# Patient Record
Sex: Female | Born: 1980 | Race: Black or African American | Hispanic: No | Marital: Single | Smoking: Current every day smoker
Health system: Southern US, Community
[De-identification: ages and names within clinical notes are randomized; demographics above are authoritative.]

## PROBLEM LIST (undated history)

## (undated) DIAGNOSIS — J45990 Exercise induced bronchospasm: Secondary | ICD-10-CM

## (undated) DIAGNOSIS — O24419 Gestational diabetes mellitus in pregnancy, unspecified control: Secondary | ICD-10-CM

## (undated) HISTORY — DX: Gestational diabetes mellitus in pregnancy, unspecified control: O24.419

## (undated) HISTORY — PX: NO PAST SURGERIES: SHX2092

## (undated) HISTORY — PX: I & D EXTREMITY: SHX5045

## (undated) HISTORY — DX: Exercise induced bronchospasm: J45.990

## (undated) HISTORY — PX: CHOLECYSTECTOMY: SHX55

---

## 2004-04-10 ENCOUNTER — Inpatient Hospital Stay: Payer: Self-pay

## 2004-04-12 ENCOUNTER — Other Ambulatory Visit: Payer: Self-pay

## 2005-12-07 ENCOUNTER — Inpatient Hospital Stay: Payer: Self-pay | Admitting: Internal Medicine

## 2005-12-07 ENCOUNTER — Other Ambulatory Visit: Payer: Self-pay

## 2006-05-28 ENCOUNTER — Emergency Department: Payer: Self-pay | Admitting: Emergency Medicine

## 2007-06-29 ENCOUNTER — Inpatient Hospital Stay: Payer: Self-pay | Admitting: Internal Medicine

## 2007-10-10 IMAGING — CT CT CHEST W/O CM
3 of 4 series · 17 of 32 positions shown, 19 images · non-contrast
Comparison: none

REASON FOR EXAM: HIGH RESOLUTION TO EVALUATE FOR ILD
COMMENTS:

[Series 2: soft tissue · axial · 0.57mm/px · z∈[+306,+480]mm · 7 of 49 slices shown]
[im 7/49  mediastinal]
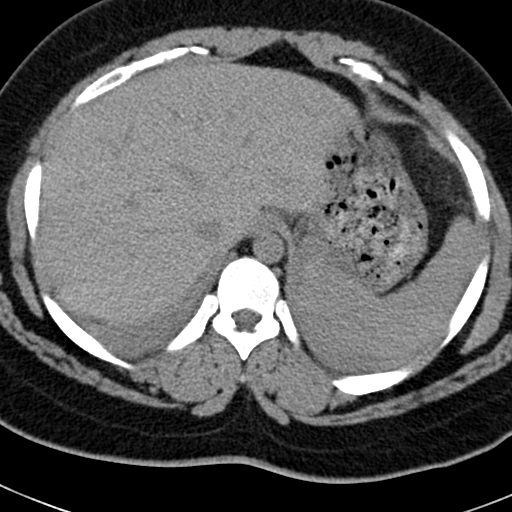
[im 14/49  mediastinal]
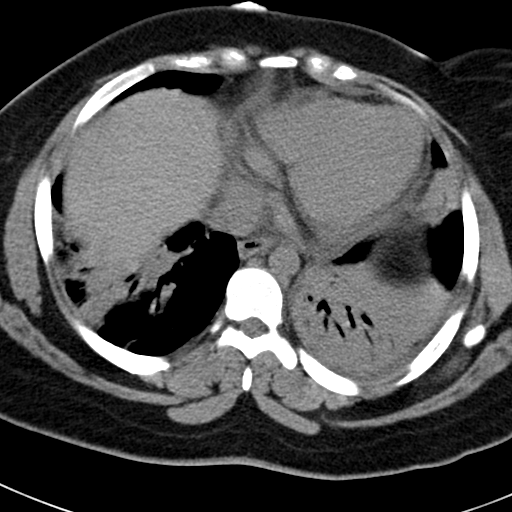
[im 21/49  mediastinal]
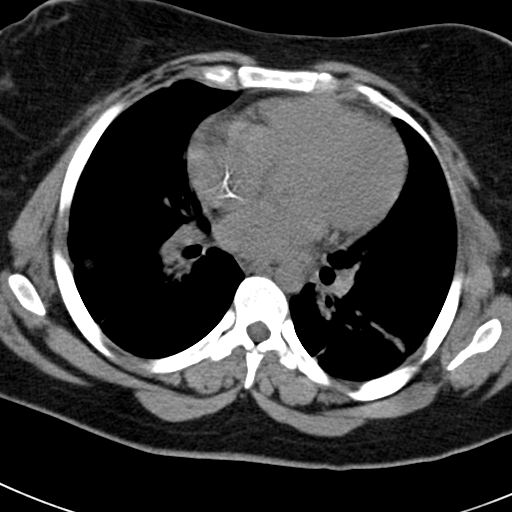
[im 24/49  mediastinal]
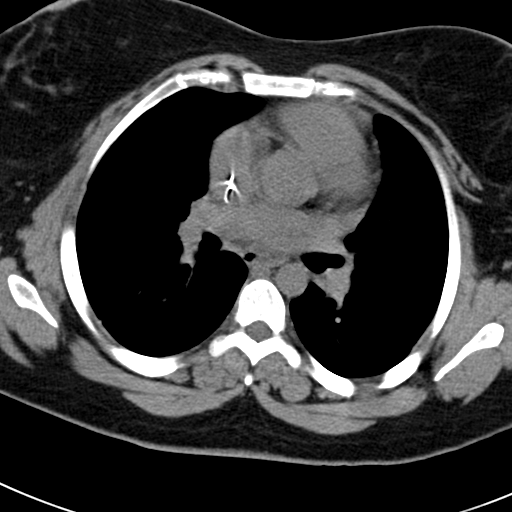
[im 28/49  mediastinal]
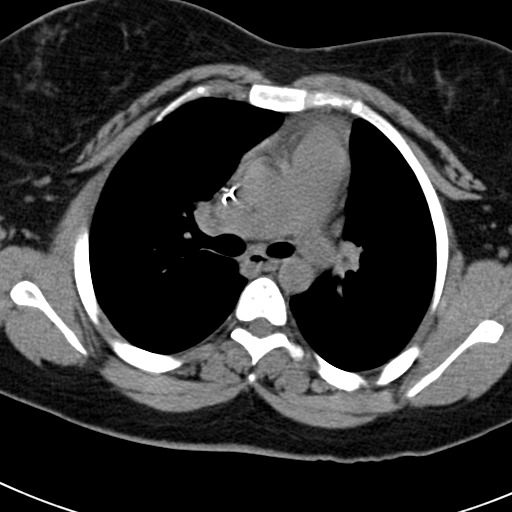
[im 35/49  mediastinal]
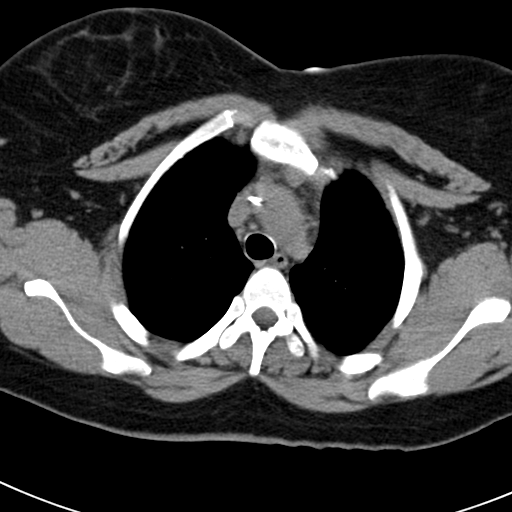
[im 42/49  mediastinal]
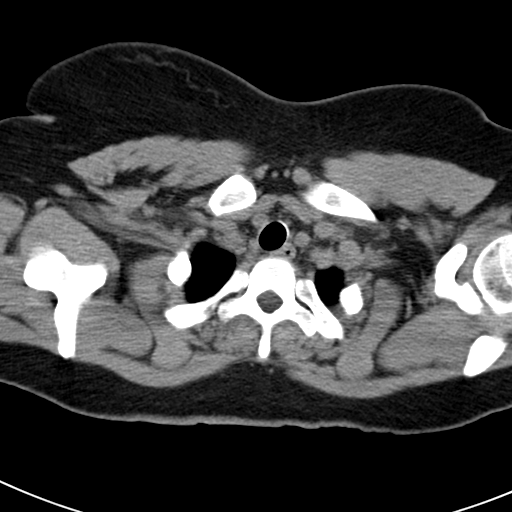

[Series 4: hi res · axial · 0.57mm/px · z∈[+321,+465]mm · 5 of 32 slices shown, 7 images (1 of 2)]
[im 7/32  mediastinal]
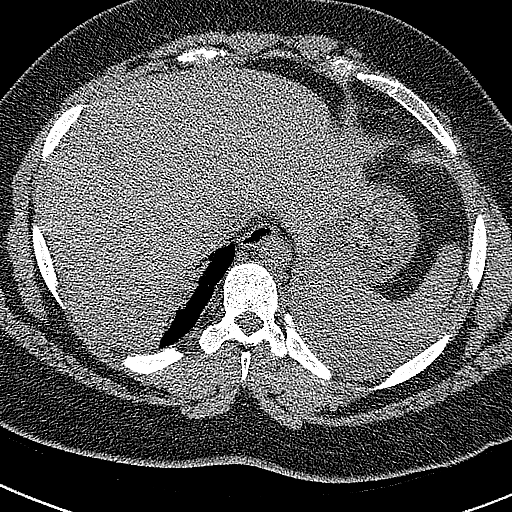
[im 7/32  lung]
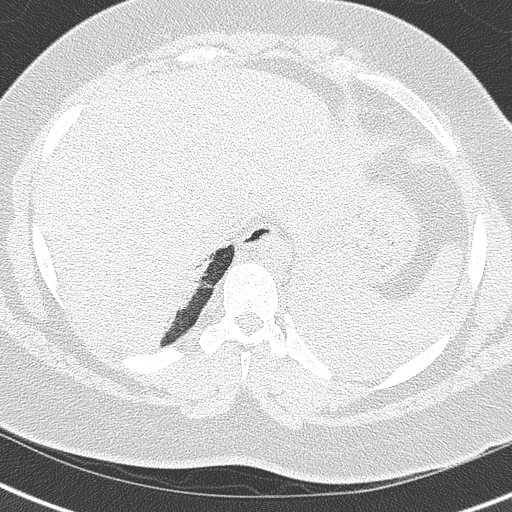
[im 13/32  lung]
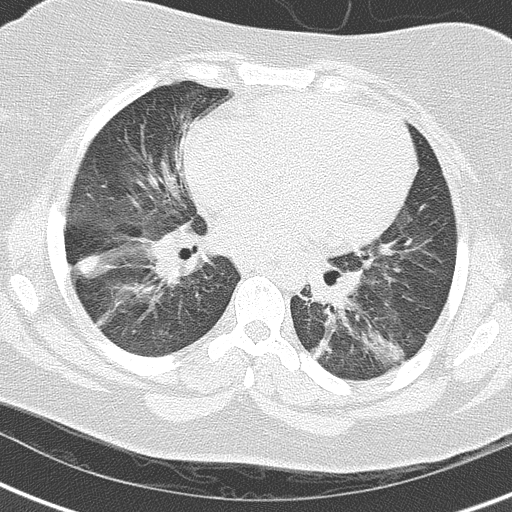
[im 16/32  lung]
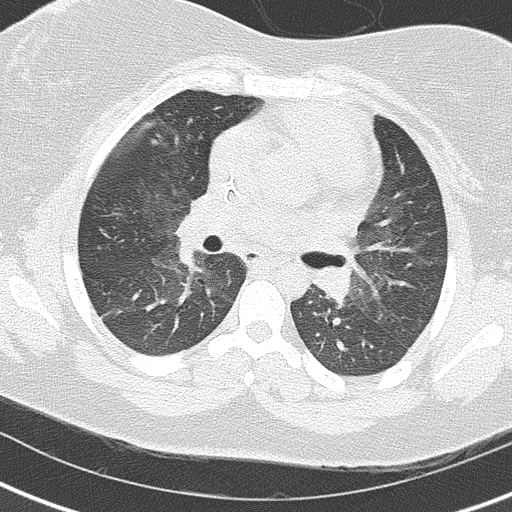
[im 19/32  lung]
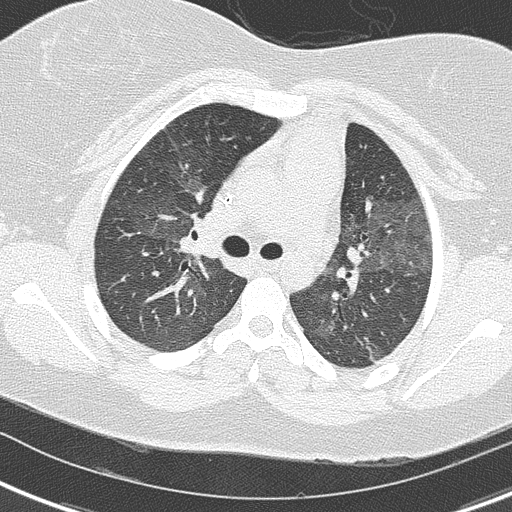
[im 25/32  mediastinal]
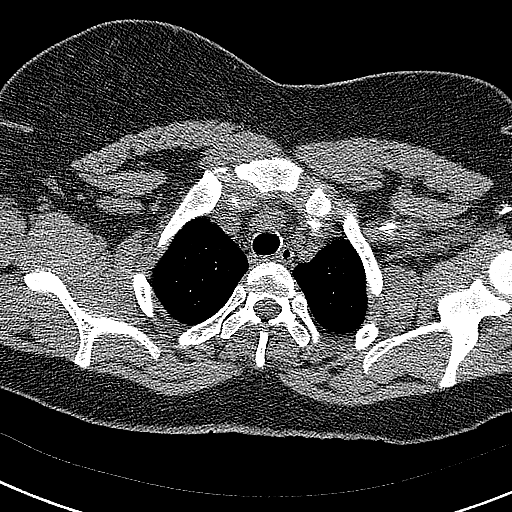
[im 25/32  lung]
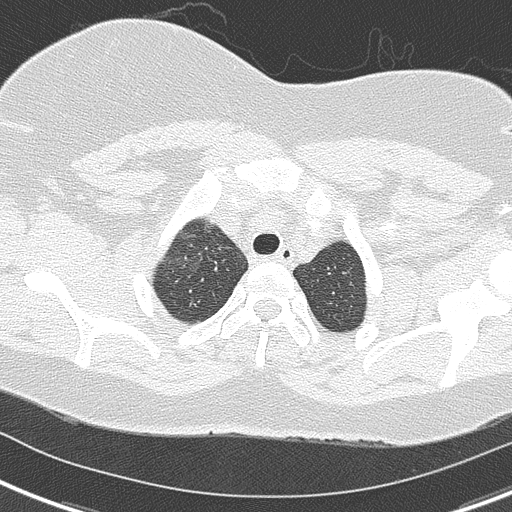

[Series 8: hi res · axial · 0.57mm/px · z∈[+321,+465]mm · 5 of 32 slices shown (2 of 2)]
[im 7/32  lung]
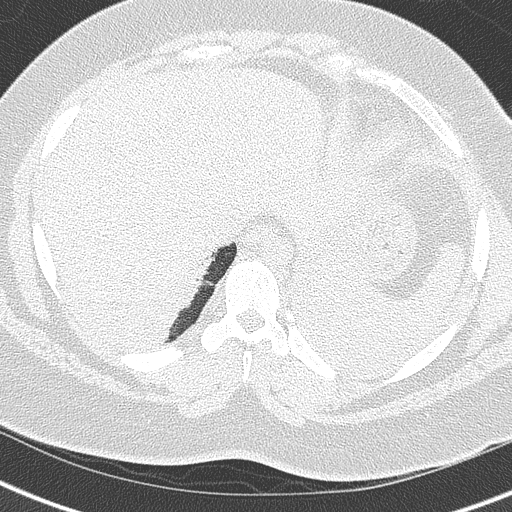
[im 13/32  lung]
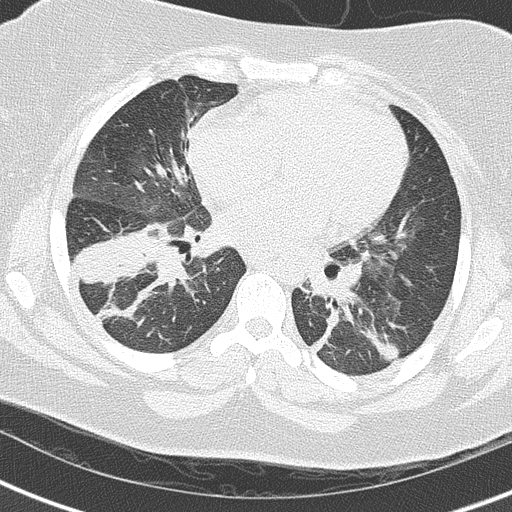
[im 16/32  lung]
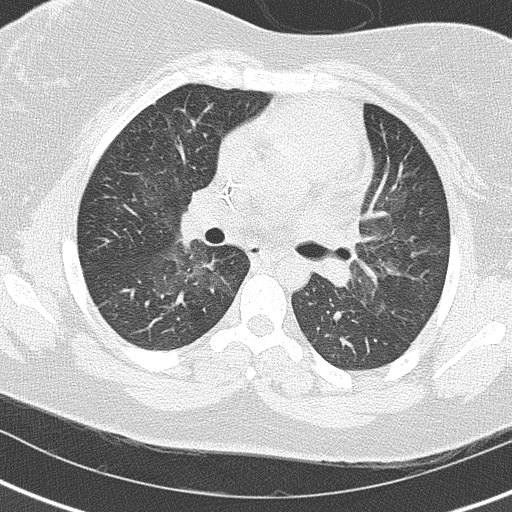
[im 19/32  lung]
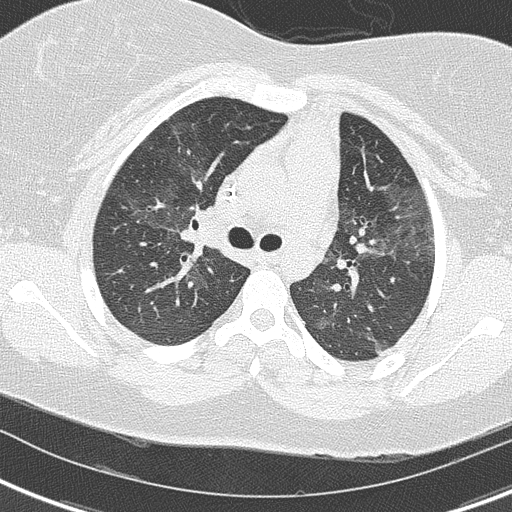
[im 25/32  lung]
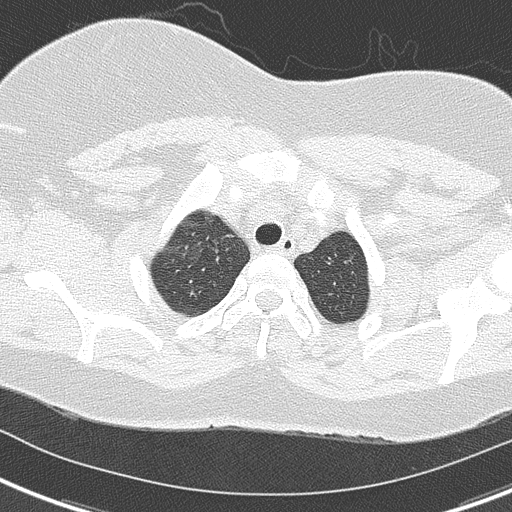

[17 of 32 positions shown; findings below may reference images not displayed]

PROCEDURE:     CT  - CT CHEST WITHOUT CONTRAST  - December 12, 2005  [DATE]

RESULT:          Comparison is made to a prior exam.

Again noted are dense bibasilar pulmonary infiltrates and patchy bilateral
mid lung infiltrates.  Diffuse interstitial lung disease is also present.
This may have progressed slightly.  There is no evidence of prominent
pleural effusion. A central venous line is noted.  The large airways are
patent.  This exam was performed without IV contrast.
IMPRESSION: Persistent bilateral dense pulmonary infiltrates with
diffuse pulmonary interstitial changes.  The interstitial changes may have
increased slightly from the prior exam.

This report was phoned to the patient's physician at the time of the study.

## 2007-10-10 IMAGING — XA DG OUTSIDE FILMS CHEST
8 series · 15 of 24 positions shown · non-contrast
Comparison: none

[Series 1: run · 2 of 15 slices shown (1 of 8)]
[im 1/15]
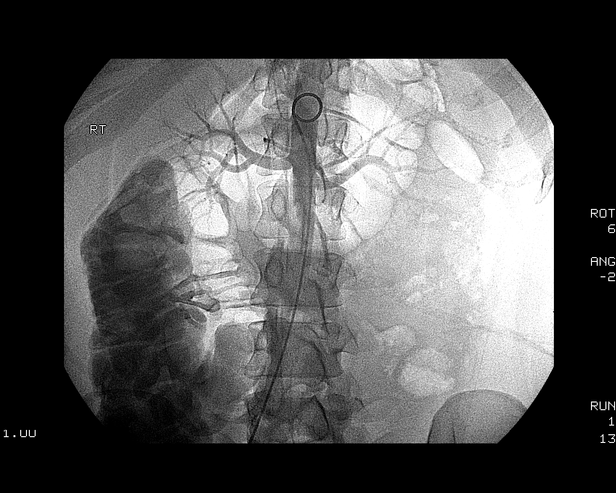
[im 15/15]
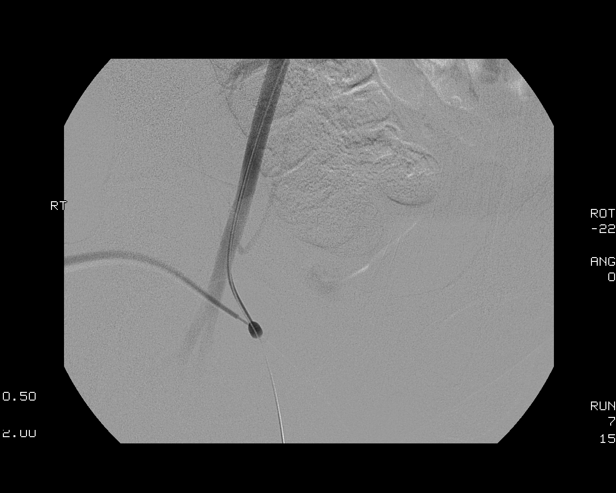

[Series 1: run · 2 of 13 slices shown (2 of 8)]
[im 7/13]
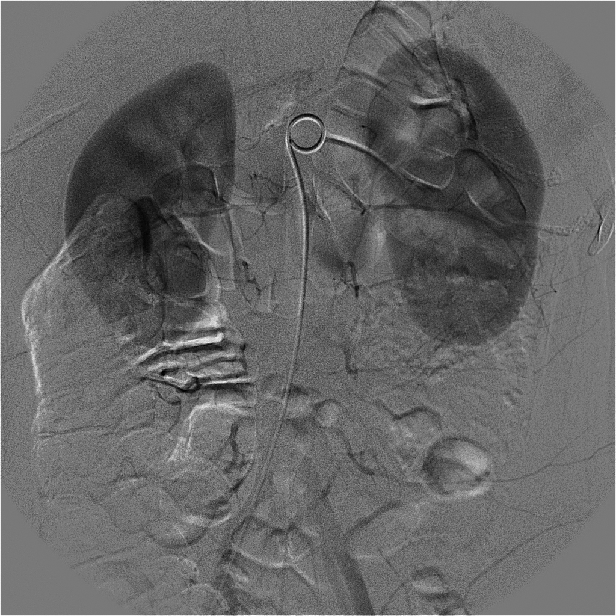
[im 13/13]
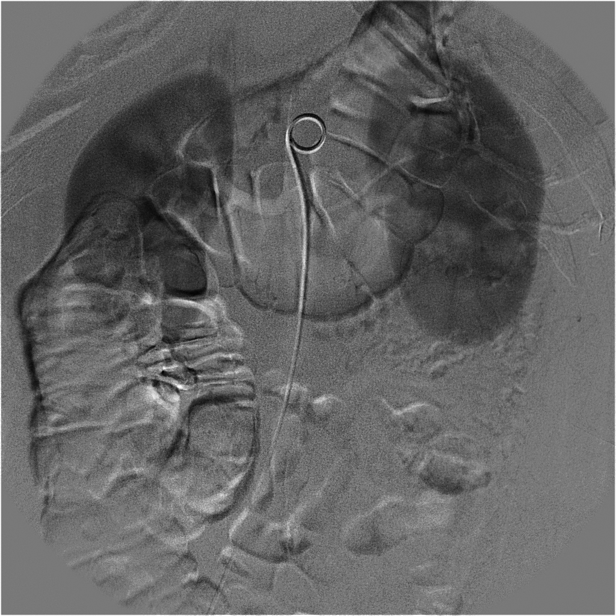

[Series 2: run · 1 of 11 slices shown (3 of 8)]
[im 11/11]
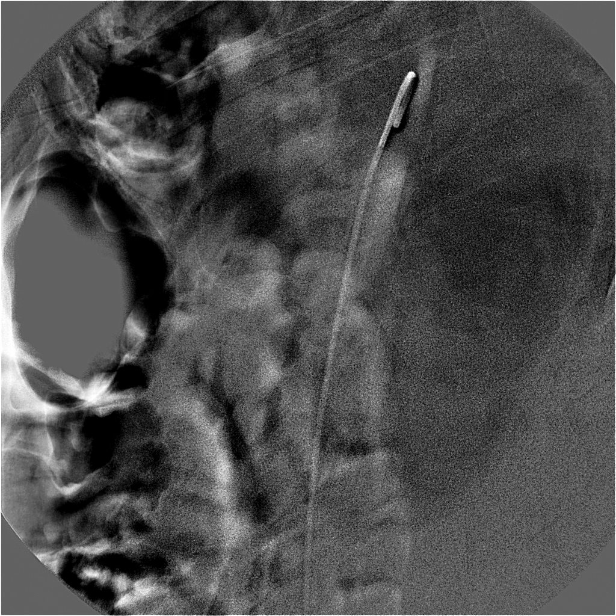

[Series 3: run · 1 of 10 slices shown (4 of 8)]
[im 1/10]
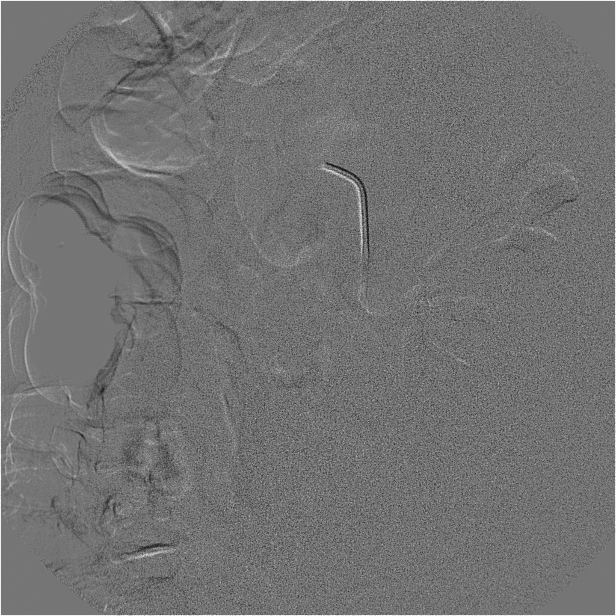

[Series 4: run · 3 of 20 slices shown (5 of 8)]
[im 1/20]
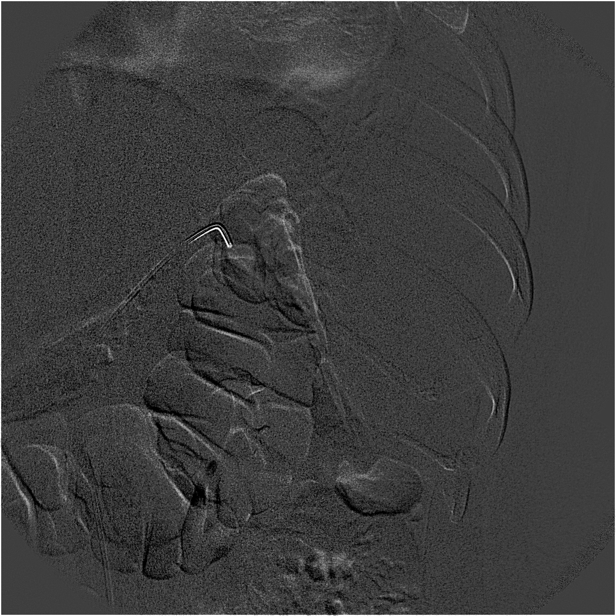
[im 13/20]
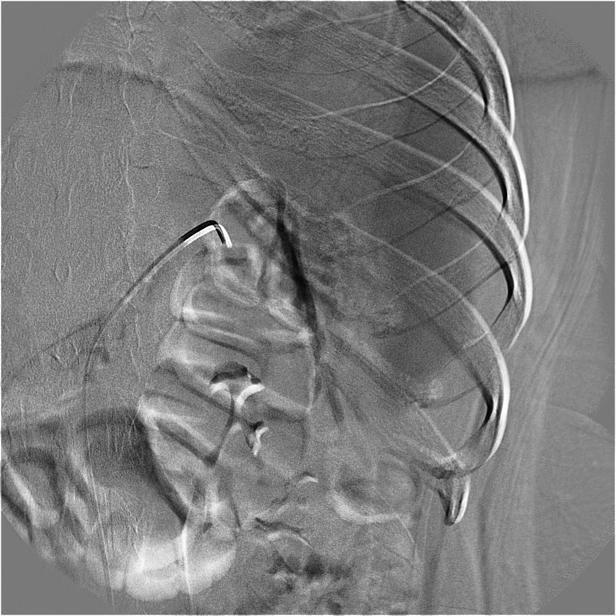
[im 20/20]
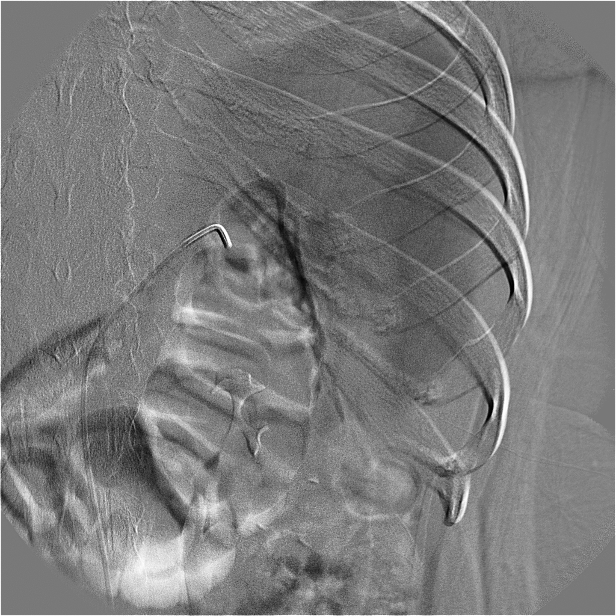

[Series 5: run · 2 of 22 slices shown (6 of 8)]
[im 8/22]
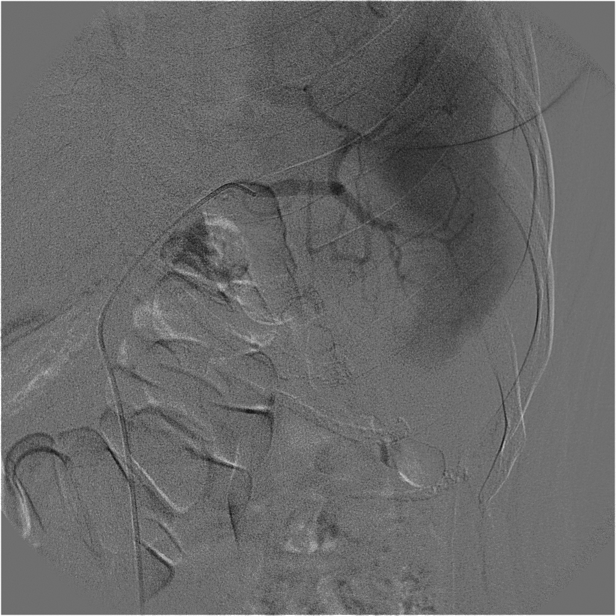
[im 15/22]
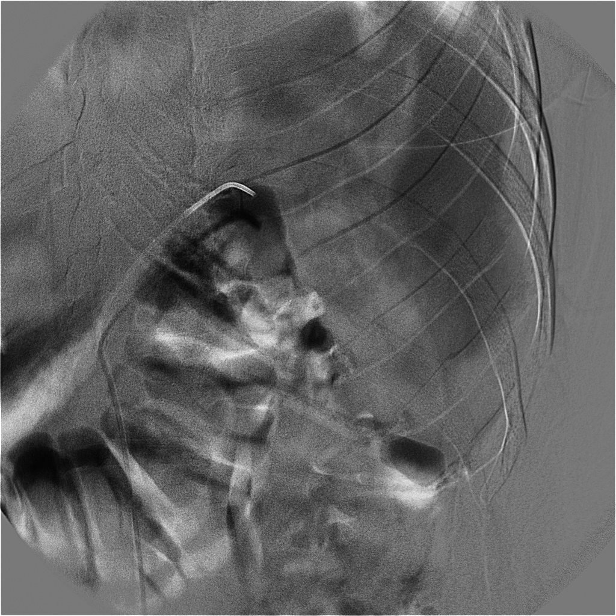

[Series 6: run · 2 of 15 slices shown (7 of 8)]
[im 1/15]
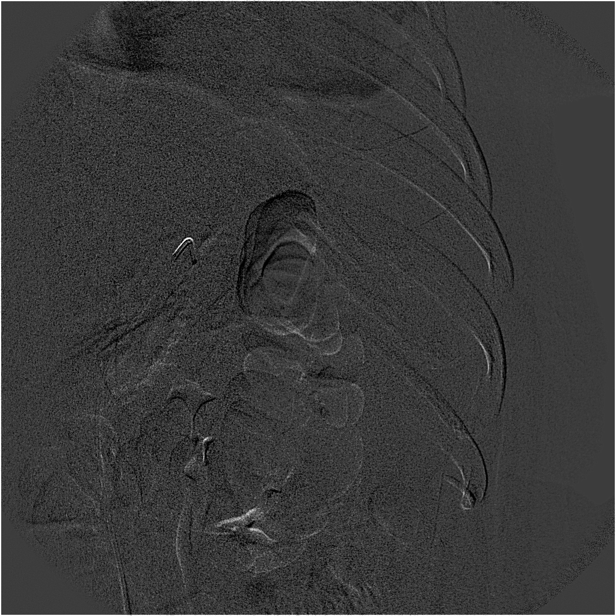
[im 15/15]
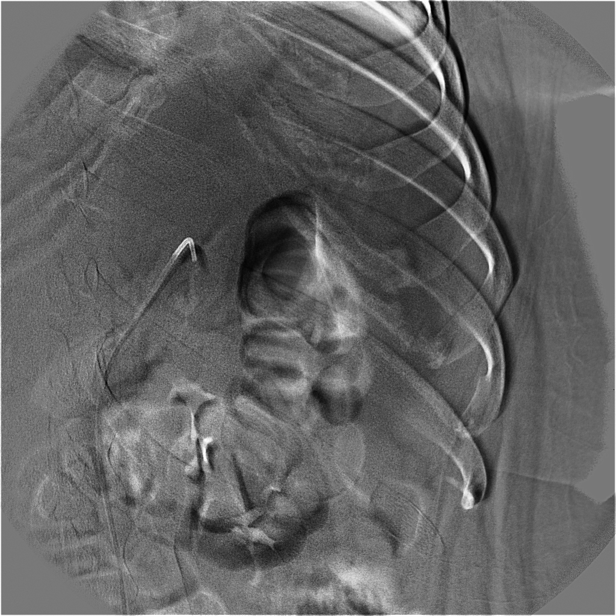

[Series 7: run · 2 of 15 slices shown (8 of 8)]
[im 1/15]
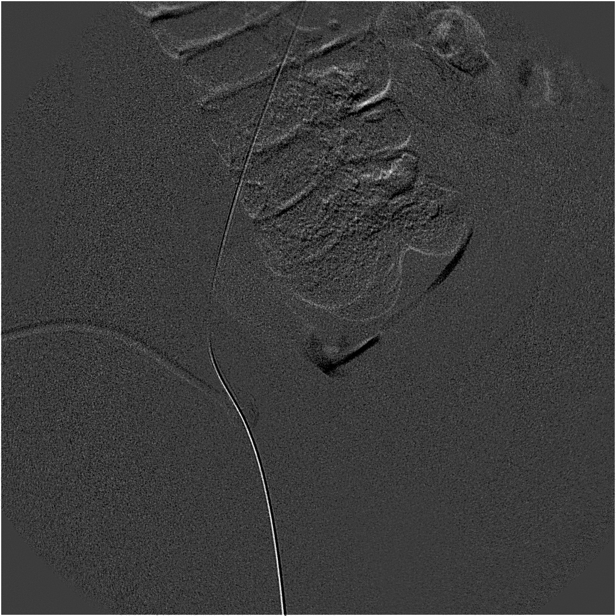
[im 15/15]
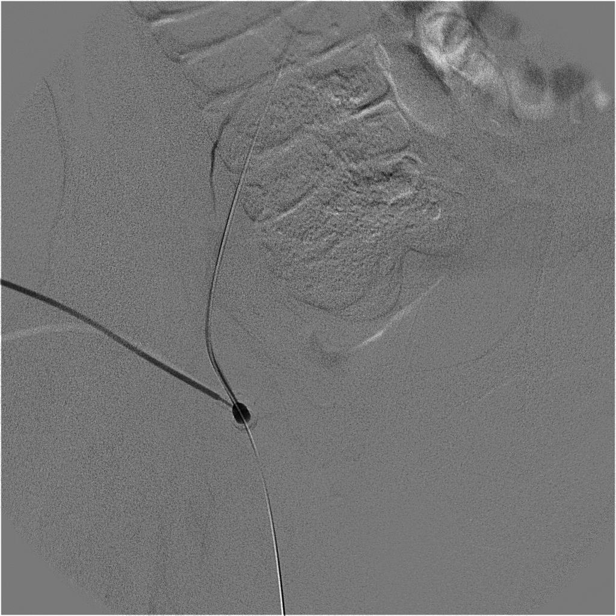

[15 of 24 positions shown; findings below may reference images not displayed]

**** An original report or order could not be provided from the [HOSPITAL] [facility] ****

## 2008-09-30 ENCOUNTER — Emergency Department: Payer: Self-pay | Admitting: Emergency Medicine

## 2010-04-08 ENCOUNTER — Emergency Department: Payer: Self-pay | Admitting: Emergency Medicine

## 2010-04-10 ENCOUNTER — Emergency Department: Payer: Self-pay | Admitting: Internal Medicine

## 2011-06-09 ENCOUNTER — Emergency Department: Payer: Self-pay | Admitting: Emergency Medicine

## 2011-06-09 LAB — CBC
HGB: 14 g/dL (ref 12.0–16.0)
MCV: 80 fL (ref 80–100)
RBC: 5.44 10*6/uL — ABNORMAL HIGH (ref 3.80–5.20)
RDW: 14.4 % (ref 11.5–14.5)
WBC: 11.9 10*3/uL — ABNORMAL HIGH (ref 3.6–11.0)

## 2011-06-09 LAB — COMPREHENSIVE METABOLIC PANEL
Albumin: 3 g/dL — ABNORMAL LOW (ref 3.4–5.0)
Alkaline Phosphatase: 72 U/L (ref 50–136)
Anion Gap: 8 (ref 7–16)
Calcium, Total: 8 mg/dL — ABNORMAL LOW (ref 8.5–10.1)
Chloride: 104 mmol/L (ref 98–107)
Co2: 29 mmol/L (ref 21–32)
EGFR (African American): >60
Osmolality: 282 (ref 275–301)
SGOT(AST): 20 U/L (ref 15–37)
SGPT (ALT): 28 U/L
Sodium: 141 mmol/L (ref 136–145)
Total Protein: 6.4 g/dL (ref 6.4–8.2)

## 2011-06-09 LAB — URINALYSIS, COMPLETE
Glucose,UR: NEGATIVE mg/dL (ref 0–75)
Ketone: NEGATIVE
Leukocyte Esterase: NEGATIVE
Nitrite: NEGATIVE
Ph: 8 (ref 4.5–8.0)
RBC,UR: <1 /HPF (ref 0–5)
Specific Gravity: 1.006 (ref 1.003–1.030)
Squamous Epithelial: 1

## 2011-06-09 LAB — TROPONIN I: Troponin-I: <0.02 ng/mL

## 2011-07-16 ENCOUNTER — Emergency Department: Payer: Self-pay | Admitting: Emergency Medicine

## 2013-12-11 ENCOUNTER — Emergency Department: Payer: Self-pay | Admitting: Internal Medicine

## 2015-04-17 ENCOUNTER — Emergency Department
Admission: EM | Admit: 2015-04-17 | Discharge: 2015-04-17 | Disposition: A | Discharge status: Home / Self Care | Payer: Self-pay | Attending: Emergency Medicine | Admitting: Emergency Medicine

## 2015-04-17 ENCOUNTER — Emergency Department: Payer: Self-pay

## 2015-04-17 ENCOUNTER — Encounter: Payer: Self-pay | Admitting: *Deleted

## 2015-04-17 DIAGNOSIS — J069 Acute upper respiratory infection, unspecified: Secondary | ICD-10-CM | POA: Insufficient documentation

## 2015-04-17 DIAGNOSIS — H9203 Otalgia, bilateral: Secondary | ICD-10-CM | POA: Insufficient documentation

## 2015-04-17 DIAGNOSIS — M549 Dorsalgia, unspecified: Secondary | ICD-10-CM | POA: Insufficient documentation

## 2015-04-17 DIAGNOSIS — J209 Acute bronchitis, unspecified: Secondary | ICD-10-CM | POA: Insufficient documentation

## 2015-04-17 MED ORDER — AZITHROMYCIN 250 MG PO TABS: ORAL_TABLET | ORAL | Status: DC

## 2015-04-17 MED ORDER — CHLORPHENIRAMINE MALEATE 4 MG PO TABS: 4.0000 mg | ORAL_TABLET | Freq: Two times a day (BID) | ORAL | Status: DC | PRN

## 2015-04-17 MED ORDER — IBUPROFEN 800 MG PO TABS: 800.0000 mg | ORAL_TABLET | Freq: Three times a day (TID) | ORAL | Status: DC | PRN

## 2015-04-17 MED ORDER — GUAIFENESIN-CODEINE 100-10 MG/5ML PO SOLN: 10.0000 mL | ORAL | Status: DC | PRN

## 2015-04-17 NOTE — ED Provider Notes (Signed)
[facility] Emergency Department Provider Note  ____________________________________________  Time seen: Approximately 4:48 PM  I have reviewed the triage vital signs and the nursing notes.   HISTORY  Chief Complaint Chills and Ear Pain    HPI Melissa Mahoney is a 35 y.o. female resents with some onset of fever chills body aches and bilateral ear pain. Patient states that she recently had a virus or other month but has some chest congestion and heaviness from coughing so much. She is concerned about pneumonia. Patient presents with no relief with her fever with Tylenol or ibuprofen.   History reviewed. No pertinent past medical history.  There are no active problems to display for this patient.   No past surgical history on file.  Current Outpatient Rx  Name  Route  Sig  Dispense  Refill  . azithromycin (ZITHROMAX Z-PAK) 250 MG tablet      Take 2 tablets (500 mg) on  Day 1,  followed by 1 tablet (250 mg) once daily on Days 2 through 5.   6 each   0   . chlorpheniramine (CHLOR-TRIMETON) 4 MG tablet   Oral   Take 1 tablet (4 mg total) by mouth 2 (two) times daily as needed for allergies or rhinitis.   30 tablet   0   . guaiFENesin-codeine 100-10 MG/5ML syrup   Oral   Take 10 mLs by mouth every 4 (four) hours as needed for cough.   180 mL   0   . ibuprofen (ADVIL,MOTRIN) 800 MG tablet   Oral   Take 1 tablet (800 mg total) by mouth every 8 (eight) hours as needed.   30 tablet   0     Allergies Shellfish allergy  History reviewed. No pertinent family history.  Social History Social History  Substance Use Topics  . Smoking status: None  . Smokeless tobacco: None  . Alcohol Use: None    Review of Systems Constitutional: Positive fever/chills. ENT: Positive sore throat. Cardiovascular: Positive chest pain secondary to coughing so hard.Marland Kitchen Respiratory: Denies shortness of breath. Positive for cough Genitourinary: Negative for  dysuria. Musculoskeletal: Positive for back pain. Skin: Negative for rash. Neurological: Positive for headaches, focal weakness or numbness.  10-point ROS otherwise negative.  ____________________________________________   PHYSICAL EXAM:  VITAL SIGNS: ED Triage Vitals  Enc Vitals Group     BP 04/17/15 1630 146/77 mmHg     Pulse Rate 04/17/15 1630 88     Resp 04/17/15 1630 18     Temp 04/17/15 1630 100.3 F (37.9 C)     Temp Source 04/17/15 1630 Oral     SpO2 04/17/15 1630 95 %     Weight 04/17/15 1630 270 lb (122.471 kg)     Height 04/17/15 1630  (1.702 m)     Head Cir --      Peak Flow --      Pain Score 04/17/15 1630 7     Pain Loc --      Pain Edu? --      Excl. in GC? --     Constitutional: Alert and oriented. Well appearing and in no acute distress. Head: Atraumatic. Nose: Positive congestion/rhinnorhea. Mouth/Throat: Mucous membranes are moist.  Oropharynx non-erythematous. Neck: No stridor. Full range of motion nontender  Respiratory: Normal respiratory effort.  No retractions. Lungs CTAB. Musculoskeletal: No lower extremity tenderness nor edema.  No joint effusions. Neurologic:  Normal speech and language. No gross focal neurologic deficits are appreciated. No gait instability.  Skin:  Skin is warm, dry and intact. No rash noted. Psychiatric: Mood and affect are normal. Speech and behavior are normal.  ____________________________________________   LABS (all labs ordered are listed, but only abnormal results are displayed)  Labs Reviewed - No data to display  RADIOLOGY  Peribronchial thickening noted. ____________________________________________   PROCEDURES  Procedure(s) performed: None  Critical Care performed: No  ____________________________________________   INITIAL IMPRESSION / ASSESSMENT AND PLAN / ED COURSE  Pertinent labs & imaging results that were available during my care of the patient were reviewed by me and considered in my  medical decision making (see chart for details).  Acute bronchitis/URI Zithromax, Robitussin-AC, Motrin 800 mg and chlorpheniramine given. Work excuse 3 days given. Patient follow-up PCP or return to ER with any worsening symptomology. ____________________________________________   FINAL CLINICAL IMPRESSION(S) / ED DIAGNOSES  Final diagnoses:  Acute bronchitis, unspecified organism  URI, acute     This chart was dictated using voice recognition software/Dragon. Despite best efforts to proofread, errors can occur which can change the meaning. Any change was purely unintentional.   Evangeline Dakin, PA-C 04/17/15 1746  Emily Filbert, MD 04/17/15 [phone]

## 2015-04-17 NOTE — ED Notes (Signed)
States she has had some symptoms of a virus since the first of the month.. States sx's eased off and then returned last week ..haivng fever and chills with ear pain

## 2015-04-17 NOTE — ED Notes (Signed)
States chills and bilateral ear pain, states she recently had a virus, states some chest heaviness from coughing

## 2015-04-17 NOTE — Discharge Instructions (Signed)
Acute Bronchitis °Bronchitis is inflammation of the airways that extend from the windpipe into the lungs (bronchi). The inflammation often causes mucus to develop. This leads to a cough, which is the most common symptom of bronchitis.  °In acute bronchitis, the condition usually develops suddenly and goes away over time, usually in a couple weeks. Smoking, allergies, and asthma can make bronchitis worse. Repeated episodes of bronchitis may cause further lung problems.  °CAUSES °Acute bronchitis is most often caused by the same virus that causes a cold. The virus can spread from person to person (contagious) through coughing, sneezing, and touching contaminated objects. °SIGNS AND SYMPTOMS  °· Cough.   °· Fever.   °· Coughing up mucus.   °· Body aches.   °· Chest congestion.   °· Chills.   °· Shortness of breath.   °· Sore throat.   °DIAGNOSIS  °Acute bronchitis is usually diagnosed through a physical exam. Your health care provider will also ask you questions about your medical history. Tests, such as chest X-rays, are sometimes done to rule out other conditions.  °TREATMENT  °Acute bronchitis usually goes away in a couple weeks. Oftentimes, no medical treatment is necessary. Medicines are sometimes given for relief of fever or cough. Antibiotic medicines are usually not needed but may be prescribed in certain situations. In some cases, an inhaler may be recommended to help reduce shortness of breath and control the cough. A cool mist vaporizer may also be used to help thin bronchial secretions and make it easier to clear the chest.  °HOME CARE INSTRUCTIONS °· Get plenty of rest.   °· Drink enough fluids to keep your urine clear or pale yellow (unless you have a medical condition that requires fluid restriction). Increasing fluids may help thin your respiratory secretions (sputum) and reduce chest congestion, and it will prevent dehydration.   °· Take medicines only as directed by your health care provider. °· If  you were prescribed an antibiotic medicine, finish it all even if you start to feel better. °· Avoid smoking and secondhand smoke. Exposure to cigarette smoke or irritating chemicals will make bronchitis worse. If you are a smoker, consider using nicotine gum or skin patches to help control withdrawal symptoms. Quitting smoking will help your lungs heal faster.   °· Reduce the chances of another bout of acute bronchitis by washing your hands frequently, avoiding people with cold symptoms, and trying not to touch your hands to your mouth, nose, or eyes.   °· Keep all follow-up visits as directed by your health care provider.   °SEEK MEDICAL CARE IF: °Your symptoms do not improve after 1 week of treatment.  °SEEK IMMEDIATE MEDICAL CARE IF: °· You develop an increased fever or chills.   °· You have chest pain.   °· You have severe shortness of breath. °· You have bloody sputum.   °· You develop dehydration. °· You faint or repeatedly feel like you are going to pass out. °· You develop repeated vomiting. °· You develop a severe headache. °MAKE SURE YOU:  °· Understand these instructions. °· Will watch your condition. °· Will get help right away if you are not doing well or get worse. °  °This information is not intended to replace advice given to you by your health care provider. Make sure you discuss any questions you have with your health care provider. °  °Document Released: 03/14/2004 Document Revised: 02/25/2014 Document Reviewed: 07/28/2012 °Elsevier Interactive Patient Education ©2016 [employer] ° °Upper Respiratory Infection, Adult °Most upper respiratory infections (URIs) are a viral infection of the air passages leading   to the lungs. A URI affects the nose, throat, and upper air passages. The most common type of URI is nasopharyngitis and is typically referred to as "the common cold." °URIs run their course and usually go away on their own. Most of the time, a URI does not require medical attention, but  sometimes a bacterial infection in the upper airways can follow a viral infection. This is called a secondary infection. Sinus and middle ear infections are common types of secondary upper respiratory infections. °Bacterial pneumonia can also complicate a URI. A URI can worsen asthma and chronic obstructive pulmonary disease (COPD). Sometimes, these complications can require emergency medical care and may be life threatening.  °CAUSES °Almost all URIs are caused by viruses. A virus is a type of germ and can spread from one person to another.  °RISKS FACTORS °You may be at risk for a URI if:  °· You smoke.   °· You have chronic heart or lung disease. °· You have a weakened defense (immune) system.   °· You are very young or very old.   °· You have nasal allergies or asthma. °· You work in crowded or poorly ventilated areas. °· You work in health care facilities or schools. °SIGNS AND SYMPTOMS  °Symptoms typically develop 2-3 days after you come in contact with a cold virus. Most viral URIs last 7-10 days. However, viral URIs from the influenza virus (flu virus) can last 14-18 days and are typically more severe. Symptoms may include:  °· Runny or stuffy (congested) nose.   °· Sneezing.   °· Cough.   °· Sore throat.   °· Headache.   °· Fatigue.   °· Fever.   °· Loss of appetite.   °· Pain in your forehead, behind your eyes, and over your cheekbones (sinus pain). °· Muscle aches.   °DIAGNOSIS  °Your health care provider may diagnose a URI by: °· Physical exam. °· Tests to check that your symptoms are not due to another condition such as: °¨ Strep throat. °¨ Sinusitis. °¨ Pneumonia. °¨ Asthma. °TREATMENT  °A URI goes away on its own with time. It cannot be cured with medicines, but medicines may be prescribed or recommended to relieve symptoms. Medicines may help: °· Reduce your fever. °· Reduce your cough. °· Relieve nasal congestion. °HOME CARE INSTRUCTIONS  °· Take medicines only as directed by your health care  provider.   °· Gargle warm saltwater or take cough drops to comfort your throat as directed by your health care provider. °· Use a warm mist humidifier or inhale steam from a shower to increase air moisture. This may make it easier to breathe. °· Drink enough fluid to keep your urine clear or pale yellow.   °· Eat soups and other clear broths and maintain good nutrition.   °· Rest as needed.   °· Return to work when your temperature has returned to normal or as your health care provider advises. You may need to stay home longer to avoid infecting others. You can also use a face mask and careful hand washing to prevent spread of the virus. °· Increase the usage of your inhaler if you have asthma.   °· Do not use any tobacco products, including cigarettes, chewing tobacco, or electronic cigarettes. If you need help quitting, ask your health care provider. °PREVENTION  °The best way to protect yourself from getting a cold is to practice good hygiene.  °· Avoid oral or hand contact with people with cold symptoms.   °· Wash your hands often if contact occurs.   °There is no clear evidence that   vitamin C, vitamin E, echinacea, or exercise reduces the chance of developing a cold. However, it is always recommended to get plenty of rest, exercise, and practice good nutrition.  °SEEK MEDICAL CARE IF:  °· You are getting worse rather than better.   °· Your symptoms are not controlled by medicine.   °· You have chills. °· You have worsening shortness of breath. °· You have brown or red mucus. °· You have yellow or brown nasal discharge. °· You have pain in your face, especially when you bend forward. °· You have a fever. °· You have swollen neck glands. °· You have pain while swallowing. °· You have white areas in the back of your throat. °SEEK IMMEDIATE MEDICAL CARE IF:  °· You have severe or persistent: °¨ Headache. °¨ Ear pain. °¨ Sinus pain. °¨ Chest pain. °· You have chronic lung disease and any of the  following: °¨ Wheezing. °¨ Prolonged cough. °¨ Coughing up blood. °¨ A change in your usual mucus. °· You have a stiff neck. °· You have changes in your: °¨ Vision. °¨ Hearing. °¨ Thinking. °¨ Mood. °MAKE SURE YOU:  °· Understand these instructions. °· Will watch your condition. °· Will get help right away if you are not doing well or get worse. °  °This information is not intended to replace advice given to you by your health care provider. Make sure you discuss any questions you have with your health care provider. °  °Document Released: 07/31/2000 Document Revised: 06/21/2014 Document Reviewed: 05/12/2013 °Elsevier Interactive Patient Education ©2016 Elsevier Inc. ° °

## 2015-11-26 ENCOUNTER — Encounter: Payer: Self-pay | Admitting: *Deleted

## 2015-11-26 ENCOUNTER — Emergency Department: Payer: Self-pay

## 2015-11-26 ENCOUNTER — Emergency Department
Admission: EM | Admit: 2015-11-26 | Discharge: 2015-11-26 | Disposition: A | Discharge status: Home / Self Care | Payer: Self-pay | Attending: Emergency Medicine | Admitting: Emergency Medicine

## 2015-11-26 DIAGNOSIS — J209 Acute bronchitis, unspecified: Secondary | ICD-10-CM | POA: Insufficient documentation

## 2015-11-26 DIAGNOSIS — Z791 Long term (current) use of non-steroidal anti-inflammatories (NSAID): Secondary | ICD-10-CM | POA: Insufficient documentation

## 2015-11-26 DIAGNOSIS — R0789 Other chest pain: Secondary | ICD-10-CM

## 2015-11-26 DIAGNOSIS — F17210 Nicotine dependence, cigarettes, uncomplicated: Secondary | ICD-10-CM | POA: Insufficient documentation

## 2015-11-26 MED ORDER — ALBUTEROL SULFATE (2.5 MG/3ML) 0.083% IN NEBU
2.5000 mg | INHALATION_SOLUTION | Freq: Once | RESPIRATORY_TRACT | Status: AC
  Administered 2015-11-26: 2.5 mg via RESPIRATORY_TRACT
  Filled 2015-11-26: qty 3

## 2015-11-26 MED ORDER — PREDNISONE 10 MG PO TABS: 50.0000 mg | ORAL_TABLET | Freq: Every day | ORAL | 0 refills | Status: DC

## 2015-11-26 MED ORDER — AZITHROMYCIN 250 MG PO TABS: ORAL_TABLET | ORAL | 0 refills | Status: DC

## 2015-11-26 MED ORDER — ALBUTEROL SULFATE HFA 108 (90 BASE) MCG/ACT IN AERS: 2.0000 | INHALATION_SPRAY | RESPIRATORY_TRACT | 1 refills | Status: DC | PRN

## 2015-11-26 NOTE — ED Triage Notes (Signed)
Pt complains of cough , congestion and chest pain, pt reports fever on Monday

## 2015-11-26 NOTE — ED Provider Notes (Signed)
[facility] Emergency Department Provider Note  ____________________________________________  Time seen: Approximately 10:16 AM  I have reviewed the triage vital signs and the nursing notes.   HISTORY  Chief Complaint Cough and Chest Pain   HPI Melissa Mahoney is a 35 y.o. female who presents to the emergency department for evaluation of left upper back and chest pain. She has had a cough since Monday, but thought it was going away until last night. The cough has returned and she now has pain in the left upper back and left chest wall. Pain increases with deep breath and movement. Pain does not radiate. She denies nausea, diaphoresis or personal history of CAD.   History reviewed. No pertinent past medical history.  There are no active problems to display for this patient.   History reviewed. No pertinent surgical history.  Prior to Admission medications   Medication Sig Start Date End Date Taking? Authorizing Provider  albuterol (PROVENTIL HFA;VENTOLIN HFA) 108 (90 Base) MCG/ACT inhaler Inhale 2 puffs into the lungs every 4 (four) hours as needed for wheezing or shortness of breath. 11/26/15   Chinita Pesterari B Fadil Macmaster, FNP  azithromycin (ZITHROMAX) 250 MG tablet 2 tablets today, then 1 tablet for the next 4 days. 11/26/15   Chinita Pesterari B Deone Omahoney, FNP  ibuprofen (ADVIL,MOTRIN) 800 MG tablet Take 1 tablet (800 mg total) by mouth every 8 (eight) hours as needed. 04/17/15   Charmayne Sheerharles M Beers, PA-C  predniSONE (DELTASONE) 10 MG tablet Take 5 tablets (50 mg total) by mouth daily. 11/26/15   Chinita Pesterari B Katriona Schmierer, FNP    Allergies Shellfish allergy  No family history on file.  Social History Social History  Substance Use Topics  . Smoking status: Current Every Day Smoker    Packs/day: 1.00    Types: Cigarettes  . Smokeless tobacco: Not on file  . Alcohol use No    Review of Systems Constitutional: Positive fever/chills on Monday. ENT: Negative for sore  throat. Cardiovascular: Denies chest pain. Respiratory: No shortness of breath. Positive for cough. Gastrointestinal: Negative for nausea,  no vomiting.  Negative diarrhea.  Musculoskeletal: Negative for body aches Skin: Negative for rash. Neurological: Negative for headaches ____________________________________________   PHYSICAL EXAM:  VITAL SIGNS: ED Triage Vitals  Enc Vitals Group     BP 11/26/15 1007 (!) 140/91     Pulse Rate 11/26/15 1007 79     Resp 11/26/15 1007 18     Temp 11/26/15 1007 98.5 F (36.9 C)     Temp Source 11/26/15 1007 Oral     SpO2 11/26/15 1007 95 %     Weight 11/26/15 1008 250 lb (113.4 kg)     Height 11/26/15 1008 5\' 6"  (1.676 m)     Head Circumference --      Peak Flow --      Pain Score 11/26/15 0958 8     Pain Loc --      Pain Edu? --      Excl. in GC? --     Constitutional: Alert and oriented. Well appearing and in no acute distress. Eyes: Conjunctivae are normal. EOMI. Nose: No sinus congestion; no rhinnorhea. Mouth/Throat: Mucous membranes are moist.  Oropharynx normal. Tonsils appear without exudate. Neck: No stridor.  Cardiovascular: Normal rate, regular rhythm. Grossly normal heart sounds.  Good peripheral circulation. Respiratory: Normal respiratory effort.  No retractions. Inspiratory wheeze heard throughout. Gastrointestinal: Soft and nontender.  Musculoskeletal: FROM x 4 extremities.  Neurologic:  Normal speech and language.  Skin:  Skin is warm, dry and intact. No rash noted. Psychiatric: Mood and affect are normal. Speech and behavior are normal.  ____________________________________________   LABS (all labs ordered are listed, but only abnormal results are displayed)  Labs Reviewed - No data to display ____________________________________________  EKG  Date: 11/26/2015  Rate: 79  Rhythm: normal sinus rhythm  QRS Axis: normal  Intervals: normal  ST/T Wave abnormalities: normal  Conduction Disutrbances: none   Narrative Interpretation: Normal sinus rhythm  Old EKG Reviewed: Not available    ____________________________________________  RADIOLOGY  No acute cardiopulmonary abnormality per radiology. ____________________________________________   PROCEDURES  Procedure(s) performed: None  Critical Care performed: No  ____________________________________________   INITIAL IMPRESSION / ASSESSMENT AND PLAN / ED COURSE  Clinical Course    Pertinent labs & imaging results that were available during my care of the patient were reviewed by me and considered in my medical decision making (see chart for details).   Albuterol nebulized treatment given with partial relief of wheezing. She is to receive prescriptions for azithromycin, prednisone, and albuterol. She is to follow up with [facility] for symptoms that are not improving over the next few days or return to the ER for symptoms that change or worsen. ____________________________________________   FINAL CLINICAL IMPRESSION(S) / ED DIAGNOSES  Final diagnoses:  Acute bronchitis, unspecified organism  Chest wall pain    Note:  This document was prepared using Dragon voice recognition software and may include unintentional dictation errors.     Chinita Pester, FNP 11/26/15 1608    Sharyn Creamer, MD 11/26/15 1610

## 2015-11-26 NOTE — ED Notes (Signed)
Flex per Dr Darnelle Catalanmalinda

## 2016-02-07 ENCOUNTER — Other Ambulatory Visit: Payer: Self-pay | Admitting: Primary Care

## 2016-02-07 DIAGNOSIS — Z331 Pregnant state, incidental: Secondary | ICD-10-CM

## 2016-02-13 ENCOUNTER — Ambulatory Visit
Admission: RE | Admit: 2016-02-13 | Discharge: 2016-02-13 | Disposition: A | Discharge status: Home / Self Care | Payer: Medicaid Other | Source: Ambulatory Visit | Attending: Primary Care | Admitting: Primary Care

## 2016-02-13 DIAGNOSIS — Z3A11 11 weeks gestation of pregnancy: Secondary | ICD-10-CM | POA: Insufficient documentation

## 2016-02-13 DIAGNOSIS — Z3687 Encounter for antenatal screening for uncertain dates: Secondary | ICD-10-CM | POA: Insufficient documentation

## 2016-02-13 DIAGNOSIS — Z331 Pregnant state, incidental: Secondary | ICD-10-CM

## 2016-05-22 ENCOUNTER — Encounter: Payer: Medicaid Other | Attending: Primary Care | Admitting: *Deleted

## 2016-05-22 ENCOUNTER — Encounter: Payer: Self-pay | Admitting: *Deleted

## 2016-05-22 VITALS — BP 130/82 | Ht 66.5 in | Wt 334.1 lb

## 2016-05-22 DIAGNOSIS — Z6841 Body Mass Index (BMI) 40.0 and over, adult: Secondary | ICD-10-CM | POA: Diagnosis not present

## 2016-05-22 DIAGNOSIS — Z713 Dietary counseling and surveillance: Secondary | ICD-10-CM | POA: Diagnosis not present

## 2016-05-22 DIAGNOSIS — O24410 Gestational diabetes mellitus in pregnancy, diet controlled: Secondary | ICD-10-CM

## 2016-05-22 DIAGNOSIS — O24419 Gestational diabetes mellitus in pregnancy, unspecified control: Secondary | ICD-10-CM | POA: Diagnosis not present

## 2016-05-22 DIAGNOSIS — Z3A00 Weeks of gestation of pregnancy not specified: Secondary | ICD-10-CM | POA: Insufficient documentation

## 2016-05-23 NOTE — Patient Instructions (Signed)
Read booklet on Gestational Diabetes Follow Gestational Meal Planning Guidelines Complete a 3 Day Food Record and bring to next appointment Avoid fruit for breakfast Avoid sugar sweetened drinks (soda, coffee) Limit snack foods that are high in fat and salt (chips) Check blood sugars 4 x day - before breakfast and 2 hrs after every meal and record  Bring blood sugar log to all appointments Call MD for prescription for meter strips and lancets Strips Accu-Chek Guide   Lancets  Accu-Chek Fastclix Purchase urine ketone strips if blood sugars are not controlled and check urine ketones every am:  If + increase bedtime snack to 1 protein and 2 carbohydrate servings Walk 20-30 minutes at least 5 x week if permitted by MD Quit smoking

## 2016-05-23 NOTE — Progress Notes (Signed)
Diabetes Self-Management Education  Visit Type: First/Initial  Appt. Start Time: 1505 Appt. End Time: 1640  05/22/2016  Ms. Melissa Mahoney, identified by name and date of birth, is a 36 y.o. female with a diagnosis of Diabetes: Gestational Diabetes.   ASSESSMENT  Blood pressure 130/82, height 5' 6.5" (1.689 m), weight (!) 334 lb 1.6 oz (151.5 kg). Body mass index is 53.12 kg/m.      Diabetes Self-Management Education - 05/22/16 1658      Visit Information   Visit Type First/Initial     Initial Visit   Diabetes Type Gestational Diabetes   Are you currently following a meal plan? No   Are you taking your medications as prescribed? Yes   Date Diagnosed 2 weeks     Health Coping   How would you rate your overall health? Fair     Psychosocial Assessment   Patient Belief/Attitude about Diabetes Afraid  "numb, scared"   Self-care barriers None   Self-management support Doctor's office   Patient Concerns Nutrition/Meal planning;Glycemic Control;Monitoring;Weight Control;Healthy Lifestyle;Problem Solving   Special Needs None   Preferred Learning Style Auditory;Visual;Hands on   Learning Readiness Ready   How often do you need to have someone help you when you read instructions, pamphlets, or other written materials from your doctor or pharmacy? 1 - Never   What is the last grade level you completed in school? 12     Pre-Education Assessment   Patient understands the diabetes disease and treatment process. Needs Instruction   Patient understands incorporating nutritional management into lifestyle. Needs Instruction   Patient undertands incorporating physical activity into lifestyle. Needs Instruction   Patient understands using medications safely. Needs Instruction   Patient understands monitoring blood glucose, interpreting and using results Needs Instruction   Patient understands prevention, detection, and treatment of acute complications. Needs Instruction   Patient  understands prevention, detection, and treatment of chronic complications. Needs Instruction   Patient understands how to develop strategies to address psychosocial issues. Needs Instruction   Patient understands how to develop strategies to promote health/change behavior. Needs Instruction     Complications   How often do you check your blood sugar? 0 times/day (not testing)  Provided Accu-Chek Guide meter and instructed on use. BG upon return demonstration was 95 mg/dL at 7:82 pm - 3 hrs pp.   Have you had a dilated eye exam in the past 12 months? Yes   Have you had a dental exam in the past 12 months? No   Are you checking your feet? Yes   How many days per week are you checking your feet? 7     Dietary Intake   Breakfast bagel, cheese, spinach; chicken biscuit; [place] pepperoni, fruit, nuts   Snack (morning) fruit   Lunch hot dog, fries, chips, grapes   Dinner roast, chicken, potatoes, greens, taco salad, pizza   Beverage(s) water, Sprite, coffee with sugar     Exercise   Exercise Type Light (walking / raking leaves)   How many days per week to you exercise? 4   How many minutes per day do you exercise? 15   Total minutes per week of exercise 60     Patient Education   Previous Diabetes Education No   Disease state  Definition of diabetes, type 1 and 2, and the diagnosis of diabetes   Nutrition management  Role of diet in the treatment of diabetes and the relationship between the three main macronutrients and blood glucose level   Physical  activity and exercise  Role of exercise on diabetes management, blood pressure control and cardiac health.   Monitoring Taught/evaluated SMBG meter.;Purpose and frequency of SMBG.;Taught/discussed recording of test results and interpretation of SMBG.;Ketone testing, when, how.   Chronic complications Relationship between chronic complications and blood glucose control   Psychosocial adjustment Identified and addressed patients feelings and  concerns about diabetes   Preconception care Pregnancy and GDM  Role of pre-pregnancy blood glucose control on the development of the fetus;Reviewed with patient blood glucose goals with pregnancy;Role of family planning for patients with diabetes   Personal strategies to promote health Review risk of smoking and offered smoking cessation     Individualized Goals (developed by patient)   Reducing Risk Improve blood sugars Prevent diabetes complications Lose weight Lead a healthier lifestyle Become more fit Quit smoking     Outcomes   Expected Outcomes Demonstrated interest in learning. Expect positive outcomes      Individualized Plan for Diabetes Self-Management Training:   Learning Objective:  Patient will have a greater understanding of diabetes self-management. Patient education plan is to attend individual and/or group sessions per assessed needs and concerns.   Plan:   Patient Instructions  Read booklet on Gestational Diabetes Follow Gestational Meal Planning Guidelines Complete a 3 Day Food Record and bring to next appointment Avoid fruit for breakfast Avoid sugar sweetened drinks (soda, coffee) Limit snack foods that are high in fat and salt (chips) Check blood sugars 4 x day - before breakfast and 2 hrs after every meal and record  Bring blood sugar log to all appointments Call MD for prescription for meter strips and lancets Strips Accu-Chek Guide   Lancets  Accu-Chek Fastclix Purchase urine ketone strips if blood sugars are not controlled and check urine ketones every am:  If + increase bedtime snack to 1 protein and 2 carbohydrate servings Walk 20-30 minutes at least 5 x week if permitted by MD Quit smoking   Expected Outcomes:  Demonstrated interest in learning. Expect positive outcomes  Education material provided: Gestational Booklet Gestational Meal Planning Guidelines Viewed Gestational Diabetes Video Meter =  Accu-Chek Guide 3 Day Food Record Goals  for a Healthy Pregnancy  If problems or questions, patient to contact team via:  Sharion Settler, RN, CCM, CDE [phone]  Future DSME appointment: May 29, 2016 with dietitian

## 2016-05-29 ENCOUNTER — Encounter: Payer: Self-pay | Admitting: Dietician

## 2016-05-29 ENCOUNTER — Encounter: Payer: Medicaid Other | Admitting: Dietician

## 2016-05-29 VITALS — BP 118/80 | Ht 66.5 in | Wt 333.2 lb

## 2016-05-29 DIAGNOSIS — O24419 Gestational diabetes mellitus in pregnancy, unspecified control: Secondary | ICD-10-CM | POA: Diagnosis not present

## 2016-05-29 DIAGNOSIS — O24410 Gestational diabetes mellitus in pregnancy, diet controlled: Secondary | ICD-10-CM

## 2016-05-29 NOTE — Progress Notes (Signed)
   Patient's BG record indicates BGs testing for one day, 05/23/16: FBG 164 after coffee with sugar flavoring, 111, 85, and 120 after meals. She ran out of strips due to some issues with testing, and has had difficulty obtaining more strips from her pharmacy. They are supposed to call her today when correct supplies arrive. She voices concern over BG control and wants to test.   Patient reports some symptoms of hypoglycemia since making diet changes; instructed her on appropriate treatment for hypoglycemia.   Patient's food diary indicates some high carb meals, some skipped meals. She has made positive diet changes and continues to work on changes.   Provided 1700kcal meal plan, and wrote individualized menus based on patient's food preferences.  Instructed patient on food safety, including avoidance of Listeriosis, and limiting mercury from fish.  Discussed importance of maintaining healthy lifestyle habits to reduce risk of Type 2 DM as well as Gestational DM with any future pregnancies.  Advised patient to use any remaining testing supplies to test some BGs after delivery, and to have BG tested ideally annually, as well as prior to attempting future pregnancies.

## 2016-05-29 NOTE — Patient Instructions (Addendum)
   Make sure to eat a meal or a snack with protein every 3-4 hours during the day to prevent low blood sugars.   If your sugar drops low, drink 4oz juice or regular soda, wait 10-15 minutes for your sugar to improve, then eat a meal or snack within 30 minutes.   Keep working to make healthy food choices, eat plenty of vegetables from the "free" list and control portions from the carb lists.   Keep up your regular exercise, good job so far!

## 2016-06-29 ENCOUNTER — Observation Stay: Payer: Medicaid Other

## 2016-06-29 ENCOUNTER — Ambulatory Visit (HOSPITAL_COMMUNITY)
Admission: AD | Admit: 2016-06-29 | Discharge: 2016-06-29 | Disposition: A | Discharge status: Home / Self Care | Payer: Medicaid Other | Source: Other Acute Inpatient Hospital | Attending: Obstetrics and Gynecology | Admitting: Obstetrics and Gynecology

## 2016-06-29 ENCOUNTER — Encounter: Payer: Self-pay | Admitting: *Deleted

## 2016-06-29 ENCOUNTER — Observation Stay
Admission: EM | Admit: 2016-06-29 | Discharge: 2016-06-29 | Disposition: A | Discharge status: Other Acute Inpatient Hospital | Payer: Medicaid Other | Attending: Obstetrics and Gynecology | Admitting: Obstetrics and Gynecology

## 2016-06-29 DIAGNOSIS — F17210 Nicotine dependence, cigarettes, uncomplicated: Secondary | ICD-10-CM | POA: Diagnosis not present

## 2016-06-29 DIAGNOSIS — J45990 Exercise induced bronchospasm: Secondary | ICD-10-CM | POA: Diagnosis not present

## 2016-06-29 DIAGNOSIS — O4703 False labor before 37 completed weeks of gestation, third trimester: Secondary | ICD-10-CM | POA: Diagnosis not present

## 2016-06-29 DIAGNOSIS — O99513 Diseases of the respiratory system complicating pregnancy, third trimester: Secondary | ICD-10-CM | POA: Insufficient documentation

## 2016-06-29 DIAGNOSIS — O99213 Obesity complicating pregnancy, third trimester: Secondary | ICD-10-CM | POA: Diagnosis not present

## 2016-06-29 DIAGNOSIS — Z7984 Long term (current) use of oral hypoglycemic drugs: Secondary | ICD-10-CM | POA: Diagnosis not present

## 2016-06-29 DIAGNOSIS — O99333 Smoking (tobacco) complicating pregnancy, third trimester: Secondary | ICD-10-CM | POA: Insufficient documentation

## 2016-06-29 DIAGNOSIS — Z3A00 Weeks of gestation of pregnancy not specified: Secondary | ICD-10-CM | POA: Insufficient documentation

## 2016-06-29 DIAGNOSIS — Z3A31 31 weeks gestation of pregnancy: Secondary | ICD-10-CM | POA: Insufficient documentation

## 2016-06-29 DIAGNOSIS — O26853 Spotting complicating pregnancy, third trimester: Secondary | ICD-10-CM | POA: Diagnosis present

## 2016-06-29 DIAGNOSIS — O24415 Gestational diabetes mellitus in pregnancy, controlled by oral hypoglycemic drugs: Secondary | ICD-10-CM | POA: Diagnosis not present

## 2016-06-29 DIAGNOSIS — O4700 False labor before 37 completed weeks of gestation, unspecified trimester: Secondary | ICD-10-CM | POA: Diagnosis present

## 2016-06-29 DIAGNOSIS — Z79899 Other long term (current) drug therapy: Secondary | ICD-10-CM | POA: Insufficient documentation

## 2016-06-29 DIAGNOSIS — O4690 Antepartum hemorrhage, unspecified, unspecified trimester: Secondary | ICD-10-CM | POA: Diagnosis present

## 2016-06-29 DIAGNOSIS — Z6841 Body Mass Index (BMI) 40.0 and over, adult: Secondary | ICD-10-CM | POA: Insufficient documentation

## 2016-06-29 LAB — URINALYSIS, COMPLETE (UACMP) WITH MICROSCOPIC
Bacteria, UA: NONE SEEN
Bilirubin Urine: NEGATIVE
Glucose, UA: NEGATIVE mg/dL
Ketones, ur: 5 mg/dL — AB
Leukocytes, UA: NEGATIVE
Nitrite: NEGATIVE
PH: 6 (ref 5.0–8.0)
Protein, ur: NEGATIVE mg/dL
Specific Gravity, Urine: 1.012 (ref 1.005–1.030)

## 2016-06-29 LAB — WET PREP, GENITAL
CLUE CELLS WET PREP: NONE SEEN
Sperm: NONE SEEN
Trich, Wet Prep: NONE SEEN
Yeast Wet Prep HPF POC: NONE SEEN

## 2016-06-29 LAB — TYPE AND SCREEN
ABO/RH(D): A POS
Antibody Screen: NEGATIVE

## 2016-06-29 MED ORDER — BETAMETHASONE SOD PHOS & ACET 6 (3-3) MG/ML IJ SUSP
12.0000 mg | INTRAMUSCULAR | Status: DC
  Administered 2016-06-29: 12 mg via INTRAMUSCULAR
  Filled 2016-06-29: qty 1

## 2016-06-29 MED ORDER — TERBUTALINE SULFATE 1 MG/ML IJ SOLN
0.2500 mg | Freq: Once | INTRAMUSCULAR | Status: AC
  Administered 2016-06-29: 0.25 mg via SUBCUTANEOUS

## 2016-06-29 MED ORDER — TERBUTALINE SULFATE 1 MG/ML IJ SOLN
INTRAMUSCULAR | Status: AC
  Administered 2016-06-29: 0.25 mg via SUBCUTANEOUS
  Filled 2016-06-29: qty 1

## 2016-06-29 MED ORDER — TERBUTALINE SULFATE 1 MG/ML IJ SOLN
0.2500 mg | Freq: Once | INTRAMUSCULAR | Status: AC
  Administered 2016-06-29: 0.25 mg via SUBCUTANEOUS
  Filled 2016-06-29: qty 1

## 2016-06-29 MED ORDER — LACTATED RINGERS IV SOLN: 500.0000 mL | INTRAVENOUS | Status: DC | PRN

## 2016-06-29 MED ORDER — OXYCODONE-ACETAMINOPHEN 5-325 MG PO TABS: 1.0000 | ORAL_TABLET | ORAL | Status: DC | PRN

## 2016-06-29 MED ORDER — ACETAMINOPHEN 325 MG PO TABS: 650.0000 mg | ORAL_TABLET | ORAL | Status: DC | PRN

## 2016-06-29 NOTE — Progress Notes (Addendum)
S: Resting comfortably.. + CTX, no LOF, VB. Ctx's starting back after first dose of Terb.  O: Vitals:   06/29/16 1246  Weight: (!) 328 lb (148.8 kg)  Height: 5\' 7"  (1.702 m)     Gen: NAD, AAOx3      Abd: FNTTP      Ext: Non-tender, Nonedmeatous    FHT: + mod var + accelerations no decelerations TOCO: Q 2 min SVE: Int Os: FT/10%/vtx-2   A/P:  36 y.o. yo G2P1000 at 2562w2d for Th PTL and vag spotting prior to arrival.   Labor: Th PTL at 31 2/7 weeks  FWB: Reassuring Cat 1 tracing. EFW 4#2oz  GBS: Unknown  Will give 2nd dose of Terb now. US ordered to check for abruption.        1st does of BMZ given   Melissa Mahoney 2:49 PM

## 2016-06-29 NOTE — Progress Notes (Signed)
Anesthesia OB Evaluation  36 y.o. female G2P1000 @ 3045w2d with BMI of 51.37 (148 kg) evaluated in OB observation room.  [facility] pregnancy because she was deemed high risk.  She reports a history of gestational diabetes, hypertension with her last pregnancy, asthma, GERD and factors predictive of sleep apnea.   Patient has factors predictive of difficult airway including super morbid obesity and a MP score of 3.  Based on our Anesthesia OB triage protocol we feel that it would be in her best interest if she was transferred to a hospital with a higher level of obstetric anesthesia care for her delivery due to her high risk nature if she becomes stable enough for transfer.  This information was conveyed to her OB team.    This information was also conveyed to the patient and she voiced understanding.

## 2016-06-29 NOTE — Discharge Summary (Signed)
Obstetric Discharge Summary     PATIENT TRANSFER NOTE Patient ID: Melissa MayhewDemicha Mahoney MRN: [id number] DOB/AGE: 36/03/1980 36 y.o.   Date of Admission: 06/29/2016  Date of Transfer  Admitting Diagnosis: IUP  at 382w2d with vaginal spotting  Secondary Diagnosis: Threatened PTL  Mode of Delivery: not delivered      Discharge Diagnosis: IUP at 31 2/7 weeks with Th PTL, vag spotting which has resolved   Intrapartum Procedures: None   Post partum procedures: None  Complications: Vag spotting x 2 episodes of bloody show, no further bleeding, Pelvic rest advised   Brief Hospital Course  Melissa Mahoney is a G2P1000 who was admitted at 8131 2/7 weeks due to vag spotting this am. Pt was contracting on the monitor but, did not feel them and was given 3 doses of Terb today which resolved the uterine pattern  But, it broke through after the 3rd dose. Since Anesthesia is mandating for [facility], [facility] and [facility].By time of transfer,  uterine contractions were minimally palpable and q 2-3 mins  and pt was  voiding without difficulty an [facility].  She will continue to take her Glyburide 1.25 mg daily.    Labs: CBC Latest Ref Rng & Units 06/09/2011  WBC 3.6 - 11.0 x10 3/mm 3 11.9(H)  Hemoglobin 12.0 - 16.0 g/dL 40.914.0  Hematocrit 81.135.0 - 47.0 % 43.7  Platelets 150 - 440 x10 3/mm 3 261   A POS  Physical exam:  Blood pressure 130/68, pulse 87, temperature 98.6 F (37 C), temperature source Oral, resp. rate 16, height 5\' 7"  (1.702 m), weight (!) 328 lb (148.8 kg). General: alert and no distress Cardiac: S1S2, RRR, No M/R/G Lungs: CTA bilat, no W/R/R. Lochia: appropriate Abdomen: soft, NT Uterus: Gravid, EFW 4#2oz Extremities: No evidence of DVT seen on physical exam. No lower extremity edema.  Transfer  Instructions: Per After Visit Summary. Activity: Advance as tolerated. Pelvic rest for now.  Also refer to  After Visit Summary Diet: Regular (Low carb) Medications: PNV  Outpatient follow up: [facility] this week   Discharged Condition: .Stable  Discharged to: Home Instructed to return here or preferably go [facility] or [facility] if in labor or for any other symptoms.   Sharee Pimplearon W Jones, CNM 06/29/2016

## 2016-06-29 NOTE — Progress Notes (Addendum)
S: I cannot feel any UC's, no further vag bleeding, no lOF, no decreased FM or any other issues. O: VSS:1 BP of 87 diastolic on entry but, since all normal UC: occasional contraction FHT: 130, mod variablity. No decels (tracing Maternal now) Fasting glucose are running in the 80's per pt 2 h running at <120 but, occas >130. Pt was started on Glyburide 1.25 mg x 3 days Voiding well.  A:1.IUP at 31 2/7 weeks 2. Th PTL 3. Extreme Obesity 4. 3 doses of Terb given before UC's stopped. 5. A2GDM now on Glyburide 6. AFI was calculated as 11.5cms and not 8.4cms P: 1. Pt to return for 2nd BMZ on Monday afternoon 2. If pt begins to bleed, fu at a tertiary center such as [facility] 3. Will dc home. Dr Dalbert GarnetBeasley was paged and aware of pt status 4. [facility].

## 2016-06-29 NOTE — H&P (Signed)
Obstetrics Admission History & Physical  Referring Provider: [facility] Primary OBGYN: [facility]   Chief Complaint: "vag spotting this am at 0500 approxa dime sized and then later a half-dollar piece. Denies any sex in the past 24 hours. No previa or other causes. Pt denies UC's, LOF, decreased FM. Pt has BMI of 51.5 and Gest DM controlled by diet. Has attended Nutritional appts.  History of Present Illness  36 y.o. G2P1000 @ 3730w2d (Dating: 08/29/16 by LMP) c/w 21 6/7 US with EDD of 08/24/16. Pregnancy complicated by:vag spotting at 31 weeks and extreme Obesity.   Ms. Melissa Mahoney presents for Vaginal spotting this am.   Review of Systems: Positive for 2 episodes of vag spotting. No fresh bright red bleeding. .  Otherwise, her 12 point review of systems is negative or as noted in the History of Present Illness.  Patient Active Problem List   Diagnosis Date Noted  . Vaginal bleeding during pregnancy 06/29/2016     PMHx:  Past Medical History:  Diagnosis Date  . Exercise-induced asthma   . Gestational diabetes   Extreme Obesity PSHx:  Past Surgical History:  Procedure Laterality Date  . NO PAST SURGERIES     Medications:  Prescriptions Prior to Admission  Medication Sig Dispense Refill Last Dose  . acetaminophen (TYLENOL) 500 MG tablet Take 1,000 mg by mouth every 6 (six) hours as needed.   06/28/2016 at Unknown time  . glipiZIDE (GLUCOTROL) 5 MG tablet Take 1.5 mg by mouth daily before breakfast.   06/28/2016 at Unknown time  . Multiple Vitamin (MULTIVITAMIN) tablet Take 1 tablet by mouth daily.   Past Week at Unknown time  . albuterol (PROVENTIL HFA;VENTOLIN HFA) 108 (90 Base) MCG/ACT inhaler Inhale 2 puffs into the lungs every 4 (four) hours as needed for wheezing or shortness of breath. (Patient not taking: Reported on 05/22/2016) 1 Inhaler 1 Not Taking at Unknown time   Allergies: is allergic to banana; iodides; and shellfish allergy. OBHx:  OB History  Gravida Para Term Preterm AB  Living  2 1 1         SAB TAB Ectopic Multiple Live Births               # Outcome Date GA Lbr Len/2nd Weight Sex Delivery Anes PTL Lv  2 Current           1 Term 01/04/02     Vag-Spont         GYNHx:  History of abnormal pap smears: neg  History of STIs: Trich during the pregnancy           FHx:  Family History  Problem Relation Age of Onset  . Diabetes Paternal Aunt   . Diabetes Paternal Uncle   . Diabetes Paternal Grandmother   . Diabetes Paternal Uncle    Soc Hx:  Social History   Social History  . Marital status: Single    Spouse name: N/A  . Number of children: N/A  . Years of education: N/A   Occupational History  . Not on file.   Social History Main Topics  . Smoking status: Current Every Day Smoker    Packs/day: 1.00    Years: 10.00    Types: Cigarettes  . 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 Alcohol use No  . Drug use: No  . Sexual activity: Yes   Other Topics Concern  .  Not on file   Social History Narrative  . No narrative on file   FOB involved and here with pt   Objective  Weight:  [328 lb (148.8 kg)] 328 lb (148.8 kg) (05/12 1246) No data recorded.  No intake or output data in the 24 hours ending 06/29/16 1324  EFM:135, +reactive NST with 2 accels 15 x 15 BPM  Toco: q 2-3 miins  General: Well nourished, well developed female in no acute distress.  HEENT:Normocephalic, Eyes non-icteric. Skin:  Warm and dry.  Cardiovascular: S1S2, RRR, No M/R/G. Respiratory:  Clear to auscultation bilateral. Normal respiratory effort. No W/R/R. Abdomen: Gravid Neuro/Psych:  Normal mood and affect.    SVE: internal os is FT/10%/vtx -2 [place]/EFW: 4#8 oz  Labs  Wet prep:Neg Clue, Neg Trich, Neg Clue 04/19/16 Ultrasounds:mild polyhydramnios, post placenta, 3 VC,  Anatomy scan WNL.    Perinatal info   Rubella  RPR /HIV HepB Surf Ag /TDaP / Flu /pap/ (Labs not  found from [facility])  Assessment & Plan   36 y.o. G2P1000 @ [redacted]w[redacted]d with signs and symptoms, with likely Th PTL IUP at 31 2/7 weeks with vaginal spotting GBs unkonwn   Sharee Pimple, MSN, CNM, FNP [facility]

## 2016-06-29 NOTE — Progress Notes (Signed)
Dr Dalbert GarnetBeasley aware of pt status and cx rechecked and unchanged but, there is a mod amt of dark old blood present on the glove after exam. Disc with the pt that we have 3 options: 1. Admit for overnight obs 2. [facility] and poss admission. 3. We also discussed poss sending home and returning in am but, since she is still contracting and I do not feel comfortable as this may be an abuption and could worsen. [facility] Dr Olga MillersBattarbee is the attending and aware and accepts the pt. Pt and partner aware of transport and ambulance BLS will go about 1945.  Sharee Pimplearon W. Jones, RN, CNM, FNP

## 2016-06-29 NOTE — Progress Notes (Signed)
Pt is awaiting healthcare linx to [facility] and treatment. Pt understands and is willing to be transported. Medical transfer sheet signed and [facility] Arrival planned at 1945.

## 2016-06-29 NOTE — OB Triage Note (Signed)
Patient admitted to OBS 3 for complaint of vaginal pain rates 3/10.  Upon awakening this am patient states that there was blood on her pajama bottoms.  She went to the bathroom to urinate and passed 2 clots.  One was dime sized and the other was silver dollar sized per patient's description.  No bleeding since episode this morning.  She denies any sexual intercourse for approximately a week.

## 2016-06-29 NOTE — Progress Notes (Addendum)
S: Resting comfortably. Pt does not feel UC's. . + CTX, no LOF, VB O: Vitals:   06/29/16 1246  Weight: (!) 328 lb (148.8 kg)  Height: 5\' 7"  (1.702 m)     Gen: NAD, AAOx3      Abd: FNTTP      Ext: Non-tender, Nonedmeatous    FHT: +mod var + accelerations no decelerations TOCO: Q 2-21/2  min SVE: FT/10%/vtx-2   A/P:  36 y.o. yo G2P1000 at 3852w2d for  Th PTL.   Labor: pt is not feeling UC's at this time  FWB: Reassuring Cat 1 tracing.   GBS: Unknown  Terb for 3rd dose. If not effective, will consider options and call Dr Bernestine AmassBeasely for consult.  US: AFI 8.4 cms   Wet Prep: Neg Trich, Neg Clue, Neg Hyphae  Urine: Clear, Bacteria: none seen, Neg Bilirubin, yellow color, Glucose neg, 5 Ketones, neg leuks, +mucous, Neg nitrites, ph 6.0, Neg protein, RBC 0-5, SG:1.012, WBC 0-5, Sq Ep:0-5             A2GDM diet controlled  Sharee Pimplearon W Katrianna Friesenhahn 4:10 PM

## 2016-06-29 NOTE — Progress Notes (Signed)
[facility].  Records being copied.  Care link notified for transport arrangement.

## 2016-06-29 NOTE — Discharge Instructions (Signed)
Preventing Preterm Birth °Preterm birth is when your baby is delivered between 20 weeks and 37 weeks of pregnancy. A full-term pregnancy lasts for at least 37 weeks. Preterm birth can be dangerous for your baby because the last few weeks of pregnancy are an important time for your baby's brain and lungs to grow. Many things can cause a baby to be born early. Sometimes the cause is not known. There are certain factors that make you more likely to experience preterm birth, such as: °· Having a previous baby born preterm. °· Being pregnant with twins or other multiples. °· Having had fertility treatment. °· Being overweight or underweight at the start of your pregnancy. °· Having any of the following during pregnancy: °¨ An infection, including a urinary tract infection (UTI) or an STI (sexually transmitted infection). °¨ High blood pressure. °¨ Diabetes. °¨ Vaginal bleeding. °· Being age 35 or older. °· Being age 18 or younger. °· Getting pregnant within 6 months of a previous pregnancy. °· Suffering extreme stress or physical or emotional abuse during pregnancy. °· Standing for long periods of time during pregnancy, such as working at a job that requires standing. °What are the risks? °The most serious risk of preterm birth is that the baby may not survive. This is more likely to happen if a baby is born before 34 weeks. Other risks and complications of preterm birth may include your baby having: °· Breathing problems. °· Brain damage that affects movement and coordination (cerebral palsy). °· Feeding difficulties. °· Vision or hearing problems. °· Infections or inflammation of the digestive tract (colitis). °· Developmental delays. °· Learning disabilities. °· Higher risk for diabetes, heart disease, and high blood pressure later in life. °What can I do to lower my risk? °Medical care °The most important thing you can do to lower your risk for preterm birth is to get routine medical care during pregnancy (prenatal  care). If you have a high risk of preterm birth, you may be referred to a health care provider who specializes in managing high-risk pregnancies (perinatologist). You may be given medicine to help prevent preterm birth. °Lifestyle changes °Certain lifestyle changes can also lower your risk of preterm birth: °· Wait at least 6 months after a pregnancy to become pregnant again. °· Try to plan pregnancy for when you are between 19 and 35 years old. °· Get to a healthy weight before getting pregnant. If you are overweight, work with your health care provider to safely lose weight. °· Do not use any products that contain nicotine or tobacco, such as cigarettes and e-cigarettes. If you need help quitting, ask your health care provider. °· Do not drink alcohol. °· Do not use drugs. °Where to find support: °For more support, consider: °· Talking with your health care provider. °· Talking with a therapist or substance abuse counselor, if you need help quitting. °· Working with a diet and nutrition specialist (dietitian) or a personal trainer to maintain a healthy weight. °· Joining a support group. °Where to find more information: °Learn more about preventing preterm birth from: °· Centers for Disease Control and Prevention: cdc.gov/reproductivehealth/maternalinfanthealth/pretermbirth.htm °· March of Dimes: marchofdimes.org/complications/premature-babies.aspx °· American Pregnancy Association: americanpregnancy.org/labor-and-birth/premature-labor °Contact a health care provider if: °· You have any of the following signs of preterm labor before 37 weeks: °¨ A change or increase in vaginal discharge. °¨ Fluid leaking from your vagina. °¨ Pressure or cramps in your lower abdomen. °¨ A backache that does not go away or gets worse. °¨   Regular tightening (contractions) in your lower abdomen. °Summary °· Preterm birth means having your baby during weeks 20-37 of pregnancy. °· Preterm birth may put your baby at risk for physical and  mental problems. °· Getting good prenatal care can help prevent preterm birth. °· You can lower your risk of preterm birth by making certain lifestyle changes, such as not smoking and not using alcohol. °This information is not intended to replace advice given to you by your health care provider. Make sure you discuss any questions you have with your health care provider. °Document Released: 03/21/2015 Document Revised: 10/14/2015 Document Reviewed: 10/14/2015 °Elsevier Interactive Patient Education © 2017 [employer] ° °

## 2016-06-29 NOTE — Progress Notes (Signed)
Report given to [facility] for transfer [facility] for [facility]. Will continue to monitor.

## 2016-06-29 NOTE — Progress Notes (Deleted)
Patient up to chair, pumping   

## 2016-06-30 LAB — URINE CULTURE: CULTURE: NO GROWTH

## 2016-07-03 ENCOUNTER — Emergency Department
Admission: EM | Admit: 2016-07-03 | Discharge: 2016-07-03 | Disposition: A | Discharge status: Home / Self Care | Payer: Medicaid Other | Attending: Emergency Medicine | Admitting: Emergency Medicine

## 2016-07-03 ENCOUNTER — Encounter: Payer: Self-pay | Admitting: Emergency Medicine

## 2016-07-03 DIAGNOSIS — O99330 Smoking (tobacco) complicating pregnancy, unspecified trimester: Secondary | ICD-10-CM | POA: Insufficient documentation

## 2016-07-03 DIAGNOSIS — Z3A00 Weeks of gestation of pregnancy not specified: Secondary | ICD-10-CM | POA: Diagnosis not present

## 2016-07-03 DIAGNOSIS — F17210 Nicotine dependence, cigarettes, uncomplicated: Secondary | ICD-10-CM | POA: Diagnosis not present

## 2016-07-03 DIAGNOSIS — O99719 Diseases of the skin and subcutaneous tissue complicating pregnancy, unspecified trimester: Secondary | ICD-10-CM | POA: Diagnosis present

## 2016-07-03 DIAGNOSIS — L03317 Cellulitis of buttock: Secondary | ICD-10-CM | POA: Insufficient documentation

## 2016-07-03 LAB — GLUCOSE, CAPILLARY: GLUCOSE-CAPILLARY: 143 mg/dL — AB (ref 65–99)

## 2016-07-03 MED ORDER — AMOXICILLIN-POT CLAVULANATE 875-125 MG PO TABS: 1.0000 | ORAL_TABLET | Freq: Two times a day (BID) | ORAL | 0 refills | Status: DC

## 2016-07-03 MED ORDER — ACETAMINOPHEN 500 MG PO TABS
1000.0000 mg | ORAL_TABLET | Freq: Once | ORAL | Status: AC
  Administered 2016-07-03: 1000 mg via ORAL
  Filled 2016-07-03: qty 2

## 2016-07-03 MED ORDER — AMOXICILLIN-POT CLAVULANATE 875-125 MG PO TABS
1.0000 | ORAL_TABLET | Freq: Once | ORAL | Status: AC
  Administered 2016-07-03: 1 via ORAL
  Filled 2016-07-03: qty 1

## 2016-07-03 MED ORDER — METRONIDAZOLE 500 MG PO TABS
500.0000 mg | ORAL_TABLET | Freq: Once | ORAL | Status: AC
  Administered 2016-07-03: 500 mg via ORAL
  Filled 2016-07-03: qty 1

## 2016-07-03 MED ORDER — METRONIDAZOLE 500 MG PO TABS: 500.0000 mg | ORAL_TABLET | Freq: Four times a day (QID) | ORAL | 0 refills | Status: AC

## 2016-07-03 NOTE — ED Provider Notes (Signed)
Medical screening examination/treatment/procedure(s) were conducted as a shared visit with non-physician practitioner(s) and myself.  I personally evaluated the patient during the encounter.  Briefly the patient is a 36 year old woman with gestational diabetes who comes to the emergency department with 1 week of painful swelling to her right inner buttock. On examination the patient clearly has induration but no appreciable fluctuance. It does not abut her anus. I performed a bedside ultrasound myself which showed cobblestoning but no abscess. At this point I'm comfortable discharging her home with a course of Augmentin and Flagyl and a 2 day wound check.   Merrily Brittleifenbark, Kayl Stogdill, MD 07/03/16 1314

## 2016-07-03 NOTE — ED Triage Notes (Signed)
Pt comes into the ED via POV c/o abscess to the right buttock.  Patient is currently pregnant with due date on 08/29/16.  Patient states the abscess has been progressively getting worse for the past week.  H/o abscesses in the past.  Patient in NAD at this time with even and unlabored respirations.  Patient uncomfortable in the triage room at this time and unable to sit comfortably.

## 2016-07-03 NOTE — ED Provider Notes (Signed)
[facility] Emergency Department Provider Note   ____________________________________________   I have reviewed the triage vital signs and the nursing notes.   HISTORY  Chief Complaint Abscess    HPI Melissa Mahoney is a 36 y.o. female abscess to the right buttock. Patient is currently pregnant with due date on 08/29/16.  Patient states the abscess has been getting worse over the last week. She was discharged [facility] for pre-term labor. She denies history of abscesses however she reports gestational diabetes and pregnancy induced hypertension. Patient denies fever, chills, headache, shortness of breath, abdominal pain, nausea and vomiting.   Past Medical History:  Diagnosis Date  . Exercise-induced asthma   . Gestational diabetes     Patient Active Problem List   Diagnosis Date Noted  . Vaginal bleeding during pregnancy 06/29/2016    Past Surgical History:  Procedure Laterality Date  . NO PAST SURGERIES      Prior to Admission medications   Medication Sig Start Date End Date Taking? Authorizing Provider  acetaminophen (TYLENOL) 500 MG tablet Take 1,000 mg by mouth every 6 (six) hours as needed.    [provider]  albuterol (PROVENTIL HFA;VENTOLIN HFA) 108 (90 Base) MCG/ACT inhaler Inhale 2 puffs into the lungs every 4 (four) hours as needed for wheezing or shortness of breath. Patient not taking: Reported on 05/22/2016 11/26/15   Kem Boroughsriplett, Cari B, FNP  amoxicillin-clavulanate (AUGMENTIN) 875-125 MG tablet Take 1 tablet by mouth 2 (two) times daily. 07/03/16   Caliann Leckrone M, PA-C  glipiZIDE (GLUCOTROL) 5 MG tablet Take 1.5 mg by mouth daily before breakfast.    [provider]  metroNIDAZOLE (FLAGYL) 500 MG tablet Take 1 tablet (500 mg total) by mouth 4 (four) times daily. 07/03/16 07/13/16  Hershal Eriksson M, PA-C  Multiple Vitamin (MULTIVITAMIN) tablet Take 1 tablet by mouth daily.    [provider]     Allergies Banana; Iodides; and Shellfish allergy  Family History  Problem Relation Age of Onset  . Diabetes Paternal Aunt   . Diabetes Paternal Uncle   . Diabetes Paternal Grandmother   . Diabetes Paternal Uncle     Social History Social History  Substance Use Topics  . Smoking status: Current Every Day Smoker    Packs/day: 1.00    Years: 10.00    Types: Cigarettes  . 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 Alcohol use No    Review of Systems Constitutional:  Negative for fever/chills Eyes: No visual changes. ENT: Negative  for sore throat. Cardiovascular: Denies chest pain. Respiratory: Denies cough Denies shortness of breath. Gastrointestinal: No abdominal pain.  No nausea, vomiting, diarrhea. Genitourinary: Negative for dysuria. Musculoskeletal: Negative for back pain. Skin: Negative for rash. Right buttock pain secondary to abscess Neurological: Negative for headaches.  ____________________________________________   PHYSICAL EXAM:  VITAL SIGNS: ED Triage Vitals  Enc Vitals Group     BP 07/03/16 1201 (!) 148/91     Pulse Rate 07/03/16 1201 (!) 111     Resp 07/03/16 1201 (!) 22     Temp 07/03/16 1201 99 F (37.2 C)     Temp Source 07/03/16 1201 Oral     SpO2 07/03/16 1201 95 %     Weight 07/03/16 1158 (!) 328 lb (148.8 kg)     Height 07/03/16 1158 5\' 7"  (1.702 m)     Head Circumference --  Peak Flow --      Pain Score 07/03/16 1158 10     Pain Loc --      Pain Edu? --      Excl. in GC? --     Constitutional: Alert and oriented. Well appearing and in no acute distress.  Head: Normocephalic and atraumatic. Eyes: Conjunctivae are normal.  Nose: No congestion/rhinorrhea/epistaxis. . Cardiovascular: Normal rate, regular rhythm. Normal distal pulses. Respiratory: Normal respiratory effort.  Gastrointestinal: Soft and nontender. No distention. Musculoskeletal:  Nontender with normal range of motion in all extremities. Neurologic: Normal speech and language.  Skin:  Skin is warm, dry and intact except right buttock region: indurated, erythematous, no fluctuance, cobblestoning with no evidence of abscess. No rash noted. Psychiatric: Mood and affect are normal. ____________________________________________   LABS (all labs ordered are listed, but only abnormal results are displayed)  Labs Reviewed  GLUCOSE, CAPILLARY - Abnormal; Notable for the following:       Result Value   Glucose-Capillary 143 (*)    All other components within normal limits  CBG MONITORING, ED   ____________________________________________  EKG none ____________________________________________  RADIOLOGY None ____________________________________________   PROCEDURES  Procedure(s) performed: no POCUS -right buttock region Cobblestoning but no abscess. No involvement near the rectum.    Critical Care performed: no ____________________________________________   INITIAL IMPRESSION / ASSESSMENT AND PLAN / ED COURSE  Pertinent labs & imaging results that were available during my care of the patient were reviewed by me and considered in my medical decision making (see chart for details).  Patient presents with 1 week history of painful swelling to her right inner buttock. Physical examination revealed induration but no appreciable fluctuance with no involvement of the anus. POCUS revealed cobblestoning but no abscess. Patient will be discharged with a course of Augmentin and Flagyl. Initial dose of antibiotics given prior to discharge.   Patient  informed of clinical course, understand medical decision-making process, and agree with plan.  Patient was advised to follow up PCP or and was also advised to return to the emergency department for symptoms that change or worsen if unable to schedule an appointment.      ____________________________________________   FINAL CLINICAL IMPRESSION(S) / ED DIAGNOSES  Final diagnoses:  Cellulitis of right buttock       NEW MEDICATIONS STARTED DURING THIS VISIT:  Discharge Medication List as of 07/03/2016  1:35 PM    START taking these medications   Details  amoxicillin-clavulanate (AUGMENTIN) 875-125 MG tablet Take 1 tablet by mouth 2 (two) times daily., Starting Wed 07/03/2016, Print    metroNIDAZOLE (FLAGYL) 500 MG tablet Take 1 tablet (500 mg total) by mouth 4 (four) times daily., Starting Wed 07/03/2016, Until Sat 07/13/2016, Print         Note:  This document was prepared using Dragon voice recognition software and may include unintentional dictation errors.   Rudi Bunyard, Jordan Likes, PA-C 07/03/16 1514    Cookie Pore, Jordan Likes, PA-C 07/03/16 1515    Ron Junco, Jordan Likes, PA-C 07/03/16 1604    Merrily Brittle, MD 07/04/16 1247

## 2016-12-23 ENCOUNTER — Encounter (HOSPITAL_COMMUNITY): Payer: Self-pay

## 2017-02-02 ENCOUNTER — Emergency Department: Payer: Self-pay

## 2017-02-02 ENCOUNTER — Encounter: Payer: Self-pay | Admitting: Emergency Medicine

## 2017-02-02 ENCOUNTER — Emergency Department
Admission: EM | Admit: 2017-02-02 | Discharge: 2017-02-02 | Disposition: A | Discharge status: Home / Self Care | Payer: Self-pay | Attending: Emergency Medicine | Admitting: Emergency Medicine

## 2017-02-02 ENCOUNTER — Other Ambulatory Visit: Payer: Self-pay

## 2017-02-02 DIAGNOSIS — Z79899 Other long term (current) drug therapy: Secondary | ICD-10-CM | POA: Insufficient documentation

## 2017-02-02 DIAGNOSIS — J40 Bronchitis, not specified as acute or chronic: Secondary | ICD-10-CM | POA: Insufficient documentation

## 2017-02-02 DIAGNOSIS — Z7984 Long term (current) use of oral hypoglycemic drugs: Secondary | ICD-10-CM | POA: Insufficient documentation

## 2017-02-02 DIAGNOSIS — F17210 Nicotine dependence, cigarettes, uncomplicated: Secondary | ICD-10-CM | POA: Insufficient documentation

## 2017-02-02 DIAGNOSIS — J45909 Unspecified asthma, uncomplicated: Secondary | ICD-10-CM

## 2017-02-02 MED ORDER — BENZONATATE 100 MG PO CAPS: 100.0000 mg | ORAL_CAPSULE | Freq: Three times a day (TID) | ORAL | 0 refills | Status: DC | PRN

## 2017-02-02 MED ORDER — ALBUTEROL SULFATE HFA 108 (90 BASE) MCG/ACT IN AERS: 2.0000 | INHALATION_SPRAY | Freq: Four times a day (QID) | RESPIRATORY_TRACT | 0 refills | Status: DC | PRN

## 2017-02-02 MED ORDER — PREDNISONE 10 MG PO TABS: ORAL_TABLET | ORAL | 0 refills | Status: DC

## 2017-02-02 MED ORDER — IPRATROPIUM-ALBUTEROL 0.5-2.5 (3) MG/3ML IN SOLN
3.0000 mL | Freq: Once | RESPIRATORY_TRACT | Status: AC
  Administered 2017-02-02: 3 mL via RESPIRATORY_TRACT
  Filled 2017-02-02: qty 3

## 2017-02-02 NOTE — ED Triage Notes (Signed)
Pt reports having subjective fevers and chills and nasal congestion x 3 days.  Pt c/o ear pain and drainage down the back of the throat. Pt has productive cough with yellow clear sputum.   Pt has hx of asthma with no inhaler use currently. States inhalers at home expired.

## 2017-02-02 NOTE — ED Provider Notes (Signed)
[facility] Emergency Department Provider Note  ____________________________________________  Time seen: Approximately 10:02 AM  I have reviewed the triage vital signs and the nursing notes.   HISTORY  Chief Complaint Cough and Nasal Congestion    HPI Melissa Mahoney is a 36 y.o. female that presents to the emergency department for evaluation of nasal congestion and productive cough with clear and yellow sputum for 3 days.  Symptoms started with congestion.  She states that she is primarily coughing up postnasal drip.  Patient has a history of asthma but realized that her inhaler is expired.  She smokes half a pack of cigarettes per day.  Her son was recently sick with similar symptoms.  She has had chills but unsure of fever.  No nausea, vomiting, abdominal pain.  Past Medical History:  Diagnosis Date  . Exercise-induced asthma   . Gestational diabetes     Patient Active Problem List   Diagnosis Date Noted  . Vaginal bleeding during pregnancy 06/29/2016    Past Surgical History:  Procedure Laterality Date  . NO PAST SURGERIES      Prior to Admission medications   Medication Sig Start Date End Date Taking? Authorizing Provider  acetaminophen (TYLENOL) 500 MG tablet Take 1,000 mg by mouth every 6 (six) hours as needed.    [provider]  albuterol (PROVENTIL HFA;VENTOLIN HFA) 108 (90 Base) MCG/ACT inhaler Inhale 2 puffs into the lungs every 6 (six) hours as needed for wheezing or shortness of breath. 02/02/17   Enid Derry, PA-C  amoxicillin-clavulanate (AUGMENTIN) 875-125 MG tablet Take 1 tablet by mouth 2 (two) times daily. 07/03/16   Little, Traci M, PA-C  benzonatate (TESSALON PERLES) 100 MG capsule Take 1 capsule (100 mg total) by mouth 3 (three) times daily as needed for cough. 02/02/17 02/02/18  Enid Derry, PA-C  glipiZIDE (GLUCOTROL) 5 MG tablet Take 1.5 mg by mouth daily before breakfast.    [provider]  Multiple  Vitamin (MULTIVITAMIN) tablet Take 1 tablet by mouth daily.    [provider]  predniSONE (DELTASONE) 10 MG tablet Take 6 tablets on day 1, take 5 tablets on day 2, take 4 tablets on day 3, take 3 tablets on day 4, take 2 tablets on day 5, take 1 tablet on day 6 02/02/17   Enid Derry, PA-C    Allergies Banana; Iodides; and Shellfish allergy  Family History  Problem Relation Age of Onset  . Diabetes Paternal Aunt   . Diabetes Paternal Uncle   . Diabetes Paternal Grandmother   . Diabetes Paternal Uncle     Social History Social History   Tobacco Use  . Smoking status: Current Every Day Smoker    Packs/day: 0.50    Years: 10.00    Pack years: 5.00    Types: Cigarettes  . Smokeless tobacco: Never Used  . 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 Alcohol use: No  . Drug use: No     Review of Systems  Eyes: No visual changes. No discharge. ENT: Positive for congestion and rhinorrhea. Respiratory: Positive for cough. No SOB. Gastrointestinal: No abdominal pain.  No nausea, no vomiting.  No diarrhea.  No constipation. Musculoskeletal: Negative for musculoskeletal pain. Skin: Negative for rash, abrasions, lacerations, ecchymosis. Neurological: Negative for headaches.   ____________________________________________   PHYSICAL EXAM:  VITAL SIGNS: ED Triage Vitals  Enc Vitals Group     BP 02/02/17  [id number] (!) 148/79     Pulse Rate 02/02/17 0904 98     Resp 02/02/17 0904 18     Temp 02/02/17 0904 98.7 F (37.1 C)     Temp Source 02/02/17 0904 Oral     SpO2 02/02/17 0904 92 %     Weight 02/02/17 0904 298 lb (135.2 kg)     Height 02/02/17 0904 5\' 7"  (1.702 m)     Head Circumference --      Peak Flow --      Pain Score 02/02/17 0903 8     Pain Loc --      Pain Edu? --      Excl. in GC? --      Constitutional: Alert and oriented. Well appearing and in no acute distress. Eyes:  Conjunctivae are normal. PERRL. EOMI. No discharge. Head: Atraumatic. ENT: No frontal and maxillary sinus tenderness.      Ears: Tympanic membranes pearly gray with good landmarks. No discharge.      Nose: Mild congestion/rhinnorhea.      Mouth/Throat: Mucous membranes are moist. Oropharynx non-erythematous. Tonsils not enlarged. No exudates. Uvula midline. Neck: No stridor.   Hematological/Lymphatic/Immunilogical: No cervical lymphadenopathy. Cardiovascular: Normal rate, regular rhythm.  Good peripheral circulation. Respiratory: Normal respiratory effort without tachypnea or retractions. Lungs CTAB. Good air entry to the bases with no decreased or absent breath sounds. Gastrointestinal: Bowel sounds 4 quadrants. Soft and nontender to palpation. No guarding or rigidity. No palpable masses. No distention. Musculoskeletal: Full range of motion to all extremities. No gross deformities appreciated. Neurologic:  Normal speech and language. No gross focal neurologic deficits are appreciated.  Skin:  Skin is warm, dry and intact. No rash noted.   ____________________________________________   LABS (all labs ordered are listed, but only abnormal results are displayed)  Labs Reviewed - No data to display ____________________________________________  EKG   ____________________________________________  RADIOLOGY  Dg Chest 2 View  Result Date: 02/02/2017 CLINICAL DATA:  Shortness of breath and cough EXAM: CHEST  2 VIEW COMPARISON:  11/26/2015 FINDINGS: Cardiac shadow is stable but mildly enlarged. The lungs are well aerated bilaterally. Increased perihilar markings are noted likely of a bronchitic nature. No focal confluent infiltrate is seen. No sizable effusion is noted. No bony abnormality is seen. IMPRESSION: Increased bronchitic markings. Electronically Signed   By: Alcide CleverMark  Lukens M.D.   On: 02/02/2017 10:45     ____________________________________________    PROCEDURES  Procedure(s) performed:    Procedures    Medications  ipratropium-albuterol (DUONEB) 0.5-2.5 (3) MG/3ML nebulizer solution 3 mL (3 mLs Nebulization Given 02/02/17 1027)     ____________________________________________   INITIAL IMPRESSION / ASSESSMENT AND PLAN / ED COURSE  Pertinent labs & imaging results that were available during my care of the patient were reviewed by me and considered in my medical decision making (see chart for details).  Review of the [place] CSRS was performed in accordance of the [facility] prior to dispensing any controlled drugs.    Patient's diagnosis is consistent with bronchitis and asthma. Vital signs and exam are reassuring.  X-ray consistent with bronchitic changes.  Patient felt better after DuoNeb.  Patient appears well and is staying well hydrated. Patient feels comfortable going home. Patient will be discharged home with prescriptions for albuterol inhaler, prednisone, tessalon perels. Patient is to follow up with PCP as needed or otherwise directed. Patient is given ED precautions to return to the ED for any worsening or new symptoms.     ____________________________________________  FINAL CLINICAL IMPRESSION(S) / ED DIAGNOSES  Final diagnoses:  Bronchitis  Asthma, unspecified asthma severity, unspecified whether complicated, unspecified whether persistent      NEW MEDICATIONS STARTED DURING THIS VISIT:  ED Discharge Orders        Ordered    albuterol (PROVENTIL HFA;VENTOLIN HFA) 108 (90 Base) MCG/ACT inhaler  Every 6 hours PRN     02/02/17 1103    predniSONE (DELTASONE) 10 MG tablet     02/02/17 1103    benzonatate (TESSALON PERLES) 100 MG capsule  3 times daily PRN     02/02/17 1103          This chart was dictated using voice recognition software/Dragon. Despite best efforts to proofread, errors can occur which can change the meaning. Any change was purely  unintentional.    Enid DerryWagner, Cyrilla Durkin, PA-C 02/02/17 1230    Darci CurrentBrown, [place] N, MD 02/03/17 [phone]

## 2017-02-02 NOTE — ED Notes (Signed)
See triage note   Presents with 3-4 day hx of nasal congestion/sinus pressure  then states sx's moved in chest  States intermittent prod cough  States she felt like she had a fever earlier  afebrile on arrival

## 2017-05-02 ENCOUNTER — Encounter: Payer: Self-pay | Admitting: Emergency Medicine

## 2017-05-02 ENCOUNTER — Other Ambulatory Visit: Payer: Self-pay

## 2017-05-02 ENCOUNTER — Emergency Department: Payer: Self-pay

## 2017-05-02 ENCOUNTER — Emergency Department
Admission: EM | Admit: 2017-05-02 | Discharge: 2017-05-02 | Disposition: A | Discharge status: Home / Self Care | Payer: Self-pay | Attending: Emergency Medicine | Admitting: Emergency Medicine

## 2017-05-02 DIAGNOSIS — F17210 Nicotine dependence, cigarettes, uncomplicated: Secondary | ICD-10-CM | POA: Insufficient documentation

## 2017-05-02 DIAGNOSIS — J069 Acute upper respiratory infection, unspecified: Secondary | ICD-10-CM | POA: Insufficient documentation

## 2017-05-02 DIAGNOSIS — Z7984 Long term (current) use of oral hypoglycemic drugs: Secondary | ICD-10-CM | POA: Insufficient documentation

## 2017-05-02 MED ORDER — AZITHROMYCIN 250 MG PO TABS: ORAL_TABLET | ORAL | 0 refills | Status: DC

## 2017-05-02 MED ORDER — BENZONATATE 200 MG PO CAPS: 200.0000 mg | ORAL_CAPSULE | Freq: Three times a day (TID) | ORAL | 0 refills | Status: DC | PRN

## 2017-05-02 NOTE — Discharge Instructions (Signed)
Follow-up with your regular doctor if not better in 3-5 days.  Use medication as prescribed.  Return to emergency department if worsening

## 2017-05-02 NOTE — ED Provider Notes (Signed)
[facility] Emergency Department Provider Note  ____________________________________________   First MD Initiated Contact with Patient 05/02/17 1434     (approximate)  I have reviewed the triage vital signs and the nursing notes.   HISTORY  Chief Complaint Cough    HPI Melissa Mahoney is a 37 y.o. female presents emergency department complaining of sinus pain and drainage with a harsh cough for least 3-5 days.  States her ears are itching also.  She states she has had a low-grade fever.  She denies any body aches.  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  She denies chest pain or shortness of breath  Past Medical History:  Diagnosis Date  . Exercise-induced asthma   . Gestational diabetes     Patient Active Problem List   Diagnosis Date Noted  . Vaginal bleeding during pregnancy 06/29/2016    Past Surgical History:  Procedure Laterality Date  . NO PAST SURGERIES      Prior to Admission medications   Medication Sig Start Date End Date Taking? Authorizing Provider  acetaminophen (TYLENOL) 500 MG tablet Take 1,000 mg by mouth every 6 (six) hours as needed.    [provider]  albuterol (PROVENTIL HFA;VENTOLIN HFA) 108 (90 Base) MCG/ACT inhaler Inhale 2 puffs into the lungs every 6 (six) hours as needed for wheezing or shortness of breath. 02/02/17   Enid Derry, PA-C  azithromycin (ZITHROMAX Z-PAK) 250 MG tablet 2 pills today then 1 pill a day for 4 days 05/02/17   Sherrie Mustache Roselyn Bering, PA-C  benzonatate (TESSALON) 200 MG capsule Take 1 capsule (200 mg total) by mouth 3 (three) times daily as needed for cough. 05/02/17   Anajulia Leyendecker, Roselyn Bering, PA-C  glipiZIDE (GLUCOTROL) 5 MG tablet Take 1.5 mg by mouth daily before breakfast.    [provider]  Multiple Vitamin (MULTIVITAMIN) tablet Take 1 tablet by mouth daily.    [provider]    Allergies Banana; Iodides; and Shellfish  allergy  Family History  Problem Relation Age of Onset  . Diabetes Paternal Aunt   . Diabetes Paternal Uncle   . Diabetes Paternal Grandmother   . Diabetes Paternal Uncle     Social History Social History   Tobacco Use  . Smoking status: Current Every Day Smoker    Packs/day: 0.50    Years: 10.00    Pack years: 5.00    Types: Cigarettes  . Smokeless tobacco: Never Used  . 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 Alcohol use: No  . Drug use: No    Review of Systems  Constitutional: Low-grade fever/chills Eyes: No visual changes. ENT: No sore throat.  Positive sinus congestion and itching ears Respiratory: Positive cough Genitourinary: Negative for dysuria. Musculoskeletal: Negative for back pain. Skin: Negative for rash.    ____________________________________________   PHYSICAL EXAM:  VITAL SIGNS: ED Triage Vitals [05/02/17 1409]  Enc Vitals Group     BP (!) 149/78     Pulse Rate 89     Resp 16     Temp 98.8 F (37.1 C)     Temp Source Oral     SpO2 94 %     Weight 298 lb (135.2 kg)     Height 5\' 7"  (1.702 m)     Head Circumference      Peak Flow  Pain Score      Pain Loc      Pain Edu?      Excl. in GC?     Constitutional: Alert and oriented. Well appearing and in no acute distress. Eyes: Conjunctivae are normal.  Head: Atraumatic. Ears: TMs are clear bilaterally Nose: No congestion/rhinnorhea. Mouth/Throat: Mucous membranes are moist.  Throat appears normal Cardiovascular: Normal rate, regular rhythm.  Heart sounds are normal Respiratory: Normal respiratory effort.  No retractions, lungs with decreased lung sounds in the right lower lung, no wheezing is noted GU: deferred Musculoskeletal: FROM all extremities, warm and well perfused Neurologic:  Normal speech and language.  Skin:  Skin is warm, dry and intact. No rash noted. Psychiatric: Mood and affect are  normal. Speech and behavior are normal.  ____________________________________________   LABS (all labs ordered are listed, but only abnormal results are displayed)  Labs Reviewed  POC URINE PREG, ED   ____________________________________________   ____________________________________________  RADIOLOGY  Chest x-ray is negative for pneumonia  ____________________________________________   PROCEDURES  Procedure(s) performed: No  Procedures    ____________________________________________   INITIAL IMPRESSION / ASSESSMENT AND PLAN / ED COURSE  Pertinent labs & imaging results that were available during my care of the patient were reviewed by me and considered in my medical decision making (see chart for details).  Patient is a 37 year old female presenting to the emergency department with upper respiratory symptoms.  On physical exam there are decreased breath sounds in the left lower lung.  Patient has a harsh cough  X-ray of the chest is negative for pneumonia  X-ray results are discussed with the patient.  She has acute bronchitis.  She is given a prescription for Zithromax and Tessalon Perles.  She was given a work note.  She was instructed to follow-up with her regular doctor if not better in 3-5 days.  If she is worsening she should return to the emergency department.  She states she understands comply with instructions.  She is discharged in stable condition     As part of my medical decision making, I reviewed the following data within the electronic MEDICAL RECORD NUMBER Nursing notes reviewed and incorporated, Radiograph reviewed chest x-ray is negative, Notes from prior ED visits and [place] Controlled Substance Database  ____________________________________________   FINAL CLINICAL IMPRESSION(S) / ED DIAGNOSES  Final diagnoses:  Acute URI      NEW MEDICATIONS STARTED DURING THIS VISIT:  Discharge Medication List as of 05/02/2017  3:59 PM    START taking  these medications   Details  azithromycin (ZITHROMAX Z-PAK) 250 MG tablet 2 pills today then 1 pill a day for 4 days, Print         Note:  This document was prepared using Dragon voice recognition software and may include unintentional dictation errors.    Faythe GheeFisher, Kandon Hosking W, PA-C 05/02/17 1747    Sharman CheekStafford, Phillip, MD 05/03/17 [phone]

## 2017-05-02 NOTE — ED Triage Notes (Signed)
Pt to ED via POV. Pt states that she has had cough and nasal congestion. Pt also c/o itching in her ears. Pt in NAD at this time.

## 2017-05-02 NOTE — ED Notes (Signed)
See triage note    States she developed some sinus pressure and drainage a few days ago    States she had some itching to ears and nasal congestion also   Denies any fever and states cough has been occasionally prod

## 2017-12-06 ENCOUNTER — Emergency Department
Admission: EM | Admit: 2017-12-06 | Discharge: 2017-12-06 | Disposition: A | Discharge status: Home / Self Care | Payer: Medicaid Other | Attending: Emergency Medicine | Admitting: Emergency Medicine

## 2017-12-06 ENCOUNTER — Encounter: Payer: Self-pay | Admitting: Emergency Medicine

## 2017-12-06 DIAGNOSIS — J069 Acute upper respiratory infection, unspecified: Secondary | ICD-10-CM | POA: Insufficient documentation

## 2017-12-06 DIAGNOSIS — Z79899 Other long term (current) drug therapy: Secondary | ICD-10-CM | POA: Insufficient documentation

## 2017-12-06 DIAGNOSIS — B9789 Other viral agents as the cause of diseases classified elsewhere: Secondary | ICD-10-CM

## 2017-12-06 DIAGNOSIS — F17210 Nicotine dependence, cigarettes, uncomplicated: Secondary | ICD-10-CM | POA: Insufficient documentation

## 2017-12-06 DIAGNOSIS — Z7984 Long term (current) use of oral hypoglycemic drugs: Secondary | ICD-10-CM | POA: Insufficient documentation

## 2017-12-06 MED ORDER — PREDNISONE 50 MG PO TABS: 50.0000 mg | ORAL_TABLET | Freq: Every day | ORAL | 0 refills | Status: DC

## 2017-12-06 MED ORDER — PREDNISONE 20 MG PO TABS
60.0000 mg | ORAL_TABLET | Freq: Once | ORAL | Status: AC
  Administered 2017-12-06: 60 mg via ORAL
  Filled 2017-12-06: qty 3

## 2017-12-06 MED ORDER — IPRATROPIUM-ALBUTEROL 0.5-2.5 (3) MG/3ML IN SOLN
3.0000 mL | Freq: Once | RESPIRATORY_TRACT | Status: AC
  Administered 2017-12-06: 3 mL via RESPIRATORY_TRACT
  Filled 2017-12-06: qty 3

## 2017-12-06 NOTE — ED Provider Notes (Signed)
[facility] Emergency Department Provider Note   ____________________________________________    I have reviewed the triage vital signs and the nursing notes.   HISTORY  Chief Complaint Nasal Congestion and Cough     HPI Melissa Mahoney is a 37 y.o. female who presents with complaints of nasal congestion and mild cough.  Patient reports that she feels mildly short of breath occasionally with exertion.  She believes that she caught a virus when she took her son to the pediatrician recently.  He has similar symptoms.  She denies recent travel.  No chest pain diaphoresis.  She does smoke cigarettes.   Past Medical History:  Diagnosis Date  . Exercise-induced asthma   . Gestational diabetes     Patient Active Problem List   Diagnosis Date Noted  . Vaginal bleeding during pregnancy 06/29/2016    Past Surgical History:  Procedure Laterality Date  . CESAREAN SECTION    . I&D EXTREMITY    . NO PAST SURGERIES      Prior to Admission medications   Medication Sig Start Date End Date Taking? Authorizing Provider  acetaminophen (TYLENOL) 500 MG tablet Take 1,000 mg by mouth every 6 (six) hours as needed.    [provider]  albuterol (PROVENTIL HFA;VENTOLIN HFA) 108 (90 Base) MCG/ACT inhaler Inhale 2 puffs into the lungs every 6 (six) hours as needed for wheezing or shortness of breath. 02/02/17   Enid Derry, PA-C  azithromycin (ZITHROMAX Z-PAK) 250 MG tablet 2 pills today then 1 pill a day for 4 days 05/02/17   Sherrie Mustache Roselyn Bering, PA-C  benzonatate (TESSALON) 200 MG capsule Take 1 capsule (200 mg total) by mouth 3 (three) times daily as needed for cough. 05/02/17   Fisher, Roselyn Bering, PA-C  glipiZIDE (GLUCOTROL) 5 MG tablet Take 1.5 mg by mouth daily before breakfast.    [provider]  Multiple Vitamin (MULTIVITAMIN) tablet Take 1 tablet by mouth daily.    [provider]  predniSONE (DELTASONE) 50 MG tablet Take 1 tablet (50  mg total) by mouth daily with breakfast. 12/06/17   Jene Every, MD     Allergies Banana; Iodides; and Shellfish allergy  Family History  Problem Relation Age of Onset  . Diabetes Paternal Aunt   . Diabetes Paternal Uncle   . Diabetes Paternal Grandmother   . Diabetes Paternal Uncle     Social History Social History   Tobacco Use  . Smoking status: Current Every Day Smoker    Packs/day: 0.50    Years: 10.00    Pack years: 5.00    Types: Cigarettes  . Smokeless tobacco: Never Used  . 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 Alcohol use: No  . Drug use: No    Review of Systems  Constitutional: No fever/chills  ENT: No sore throat.  Aspiratory: Positive cough Gastrointestinal:  No nausea, no vomiting.    Musculoskeletal: Negative for back pain. Skin: Negative for rash. Neurological: Negative for headaches     ____________________________________________   PHYSICAL EXAM:  VITAL SIGNS: ED Triage Vitals  Enc Vitals Group     BP 12/06/17 2048 (!) 146/69     Pulse Rate 12/06/17 2048 86     Resp 12/06/17 2048 20     Temp 12/06/17 2048 99.2 F (37.3 C)     Temp Source 12/06/17 2048 Oral     SpO2 12/06/17 2048 95 %  Weight 12/06/17 2048 131.5 kg (290 lb)     Height 12/06/17 2048 1.702 m (5\' 7" )     Head Circumference --      Peak Flow --      Pain Score 12/06/17 2231 7     Pain Loc --      Pain Edu? --      Excl. in [place]? --     Constitutional: Alert and oriented. No acute distress. Pleasant and interactive Eyes: Conjunctivae are normal.   Nose: No congestion/rhinnorhea. Mouth/Throat: Mucous membranes are moist.   Cardiovascular: Normal rate, regular rhythm.  Respiratory: Normal respiratory effort.  No retractions.  Scattered mild wheezes  Musculoskeletal: No lower extremity tenderness nor edema.   Neurologic:  Normal speech and language. No gross focal neurologic  deficits are appreciated.   Skin:  Skin is warm, dry and intact. No rash noted.   ____________________________________________   LABS (all labs ordered are listed, but only abnormal results are displayed)  Labs Reviewed - No data to display ____________________________________________  EKG   ____________________________________________  RADIOLOGY  None ____________________________________________   PROCEDURES  Procedure(s) performed: No  Procedures   Critical Care performed: No ____________________________________________   INITIAL IMPRESSION / ASSESSMENT AND PLAN / ED COURSE  Pertinent labs & imaging results that were available during my care of the patient were reviewed by me and considered in my medical decision making (see chart for details).  Patient well-appearing in no acute distress, scattered mild wheezes treated with DuoNeb's, prednisone.  Suspect viral upper respiratory infection, counseled smoking cessation.  Follow with PCP   ____________________________________________   FINAL CLINICAL IMPRESSION(S) / ED DIAGNOSES  Final diagnoses:  Viral URI with cough      NEW MEDICATIONS STARTED DURING THIS VISIT:  Discharge Medication List as of 12/06/2017  9:56 PM    START taking these medications   Details  predniSONE (DELTASONE) 50 MG tablet Take 1 tablet (50 mg total) by mouth daily with breakfast., Starting Sat 12/06/2017, Print         Note:  This document was prepared using Dragon voice recognition software and may include unintentional dictation errors.    Jene Every, MD 12/06/17 2258

## 2017-12-06 NOTE — ED Triage Notes (Signed)
Patient with complaint of cough and congestion that started yesterday that has progressively gotten worse. patient denies fever.

## 2018-04-15 IMAGING — US US OB LIMITED
1 series · 14 of 28 positions shown · non-contrast
Comparison: none

CLINICAL DATA: Vaginal bleeding

EXAM:
LIMITED OBSTETRIC ULTRASOUND

[Series 1: us ob limited · 0.26mm/px · 37 acquisitions, 14 frames shown]
[im 2/37]
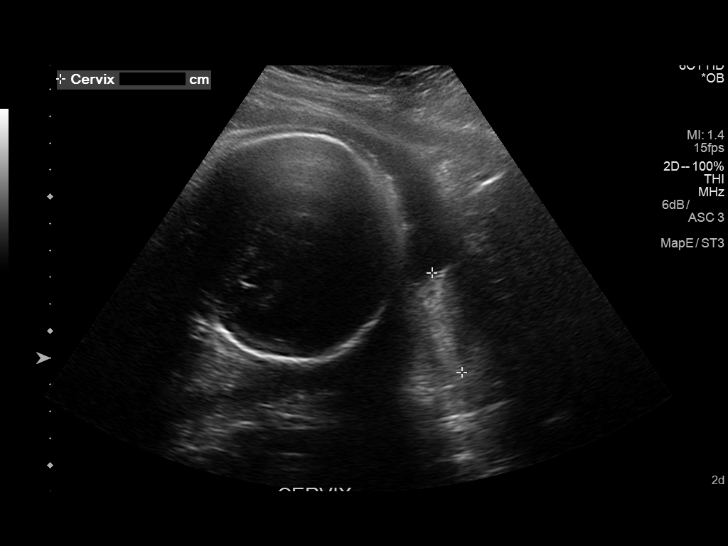
[im 5/37]
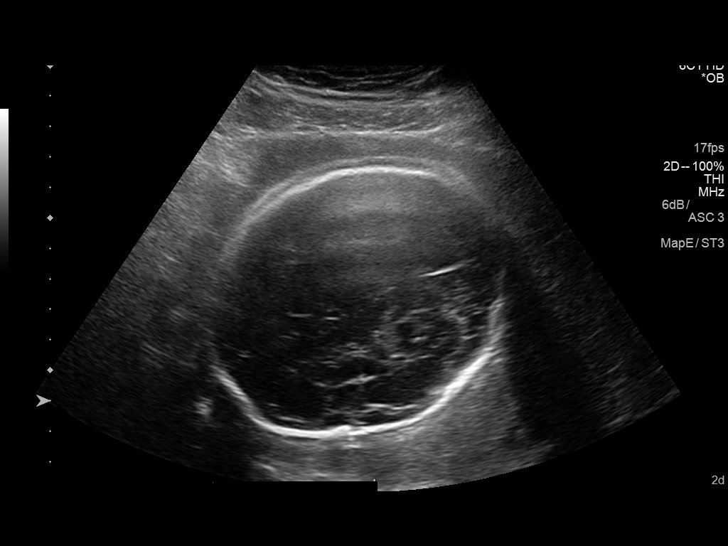
[im 7/37]
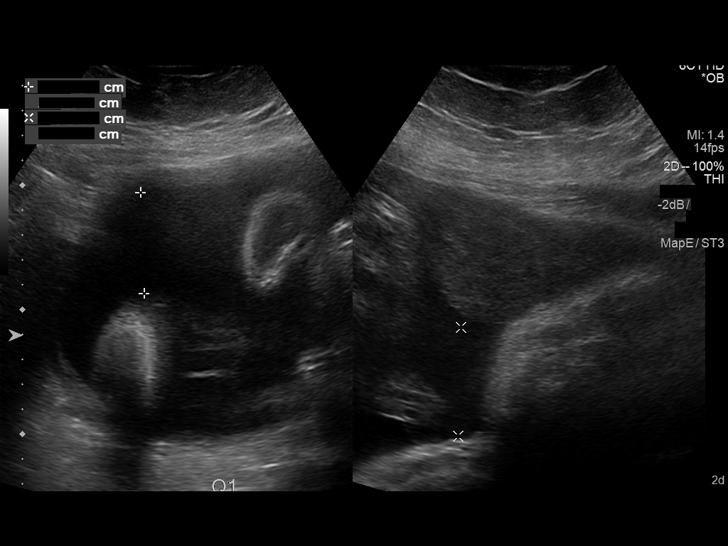
[im 10/37]
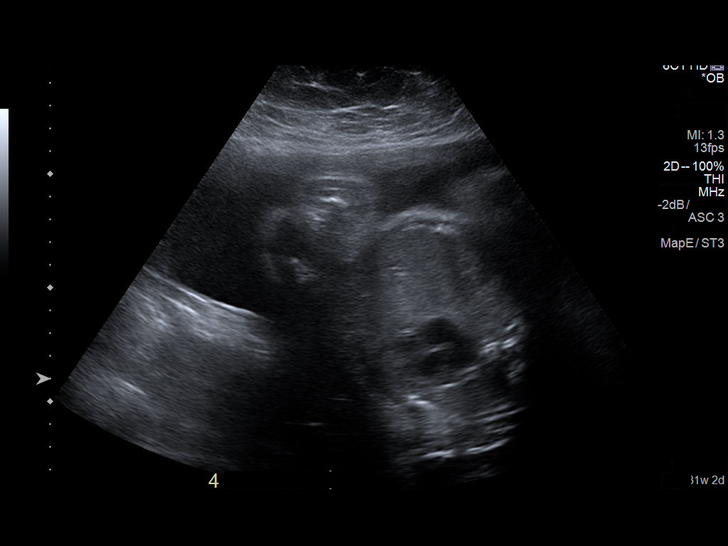
[im 13/37]
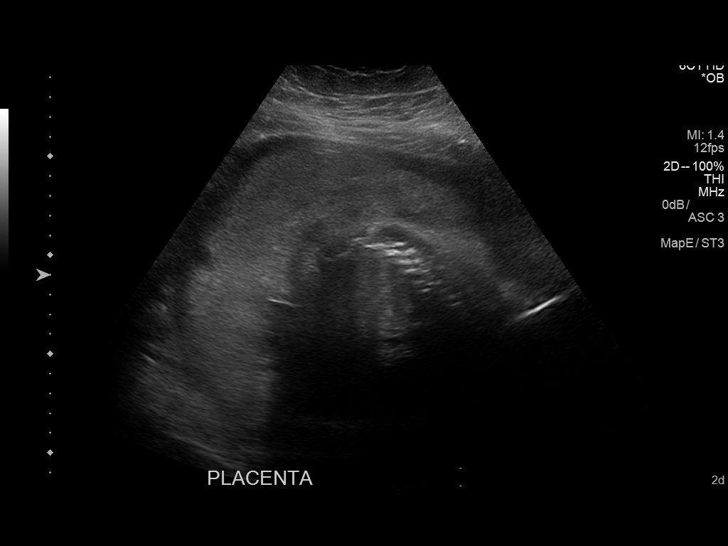
[im 15/37]
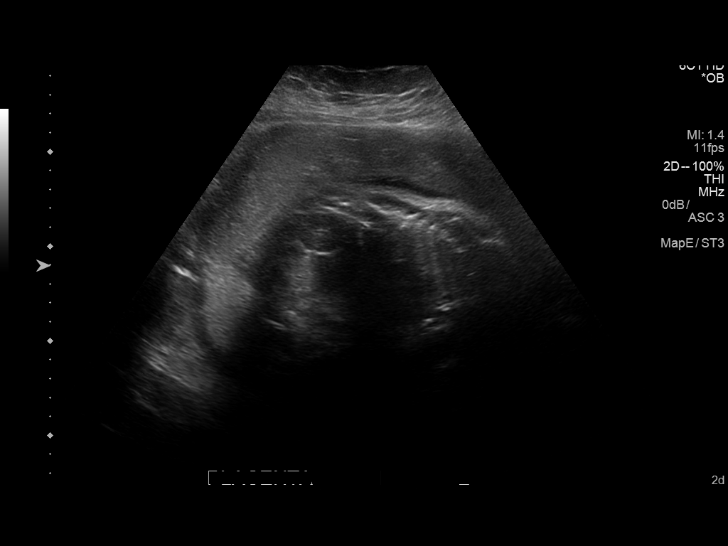
[im 18/37]
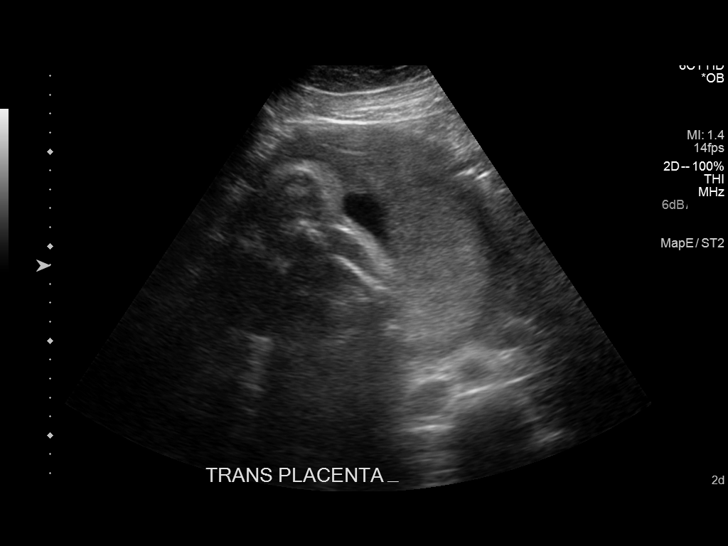
[im 21/37]
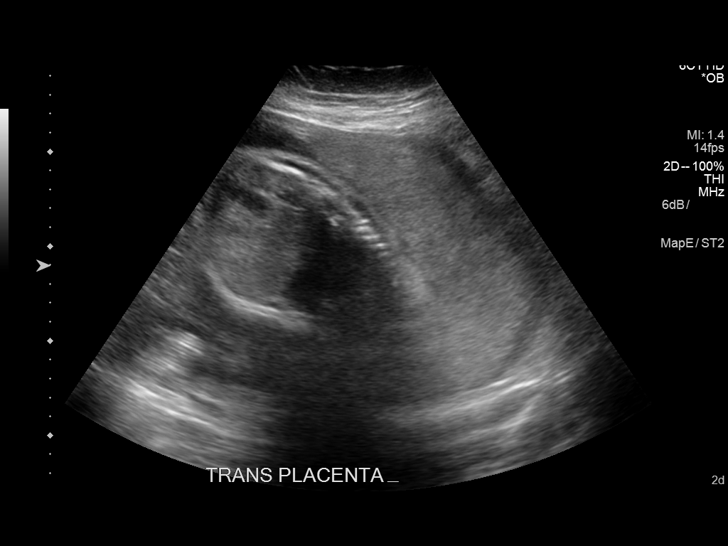
[im 23/37]
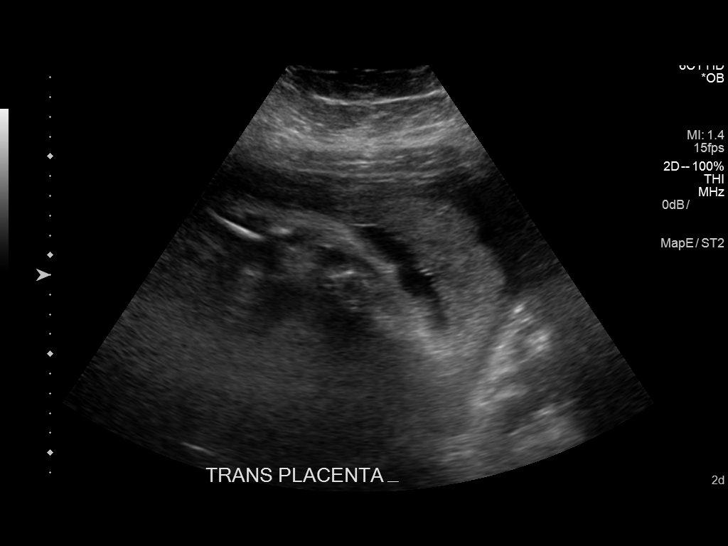
[im 26/37]
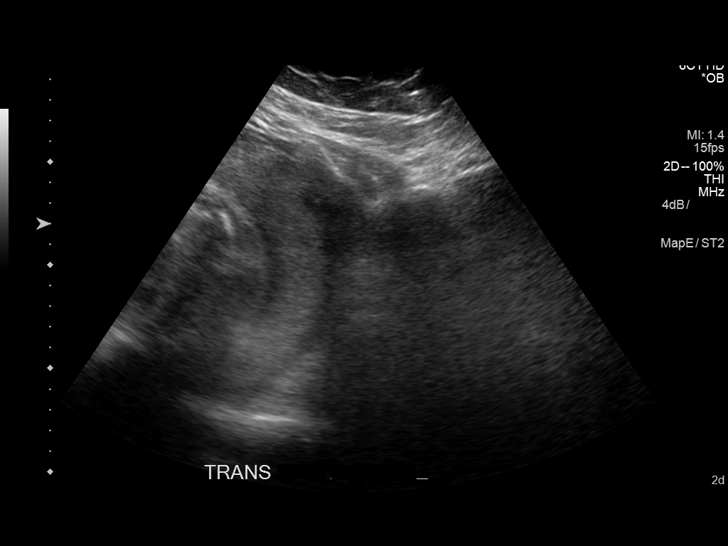
[im 29/37]
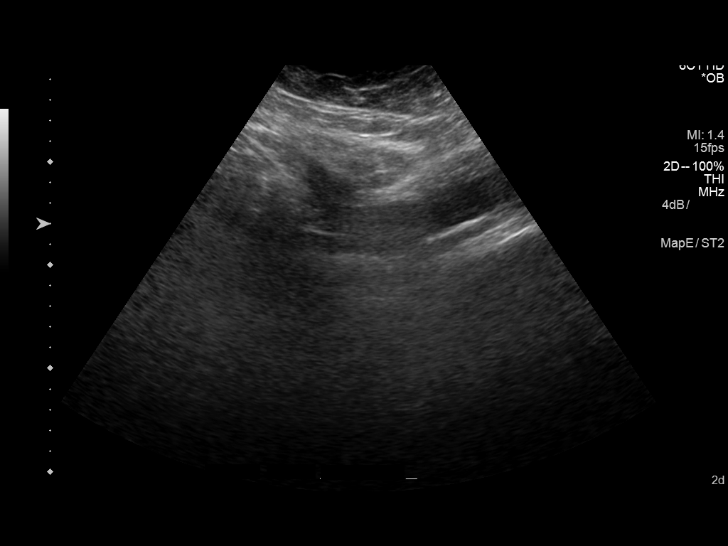
[im 31/37]
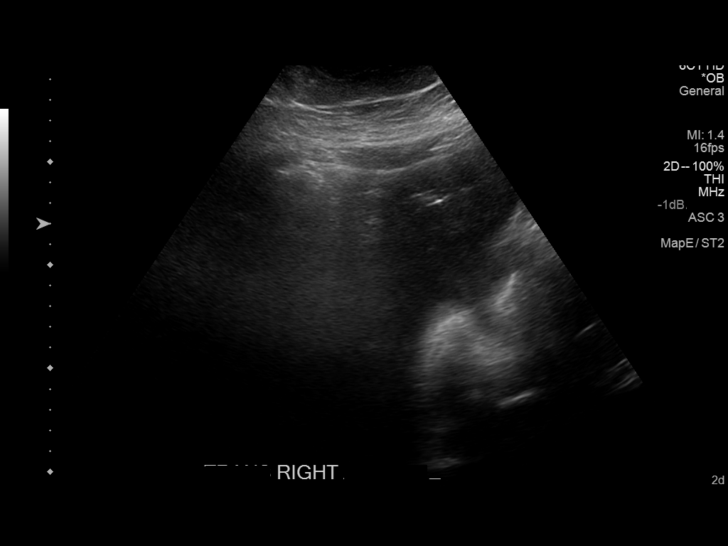
[im 34/37]
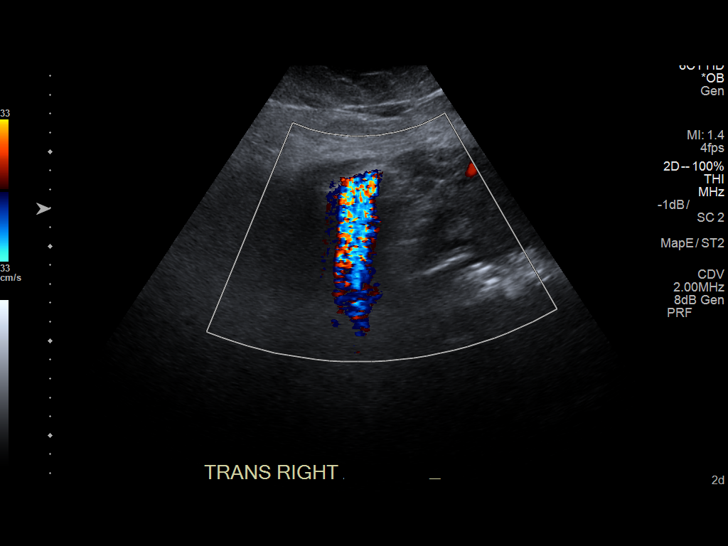
[im 37/37]
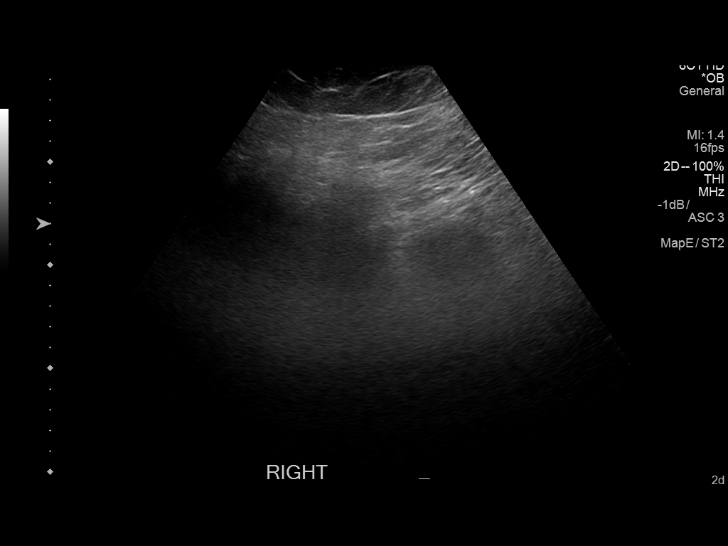

[14 of 28 positions shown; findings below may reference images not displayed]

FINDINGS: Number of Fetuses: 1

Heart Rate:  140 bpm

Movement: Yes

Presentation: Cephalic

Placental Location: Left fundal

Previa: No

Amniotic Fluid (Subjective):  Within normal limits.

AFI:

BPD:  8.5cm 34w  2d

MATERNAL FINDINGS:

Cervix:  Appears closed.

Uterus/Adnexae: Ovaries not visualized.
IMPRESSION: No abnormalities are identified on today's study. Single live
intrauterine pregnancy with no cause for bleeding identified.

This exam is performed on an emergent basis and does not
comprehensively evaluate fetal size, dating, or anatomy; follow-up
complete OB US should be considered if further fetal assessment is
warranted.

## 2021-09-08 ENCOUNTER — Other Ambulatory Visit: Payer: Self-pay

## 2021-09-08 ENCOUNTER — Encounter: Payer: Self-pay | Admitting: Intensive Care

## 2021-09-08 ENCOUNTER — Inpatient Hospital Stay
Admission: EM | Admit: 2021-09-08 | Discharge: 2021-09-11 | DRG: 418 | Disposition: A | Discharge status: Home / Self Care | Payer: Self-pay | Attending: Surgery | Admitting: Surgery

## 2021-09-08 ENCOUNTER — Emergency Department: Payer: Self-pay

## 2021-09-08 DIAGNOSIS — R12 Heartburn: Secondary | ICD-10-CM | POA: Diagnosis present

## 2021-09-08 DIAGNOSIS — E871 Hypo-osmolality and hyponatremia: Secondary | ICD-10-CM | POA: Diagnosis present

## 2021-09-08 DIAGNOSIS — Z91013 Allergy to seafood: Secondary | ICD-10-CM

## 2021-09-08 DIAGNOSIS — K760 Fatty (change of) liver, not elsewhere classified: Secondary | ICD-10-CM | POA: Diagnosis present

## 2021-09-08 DIAGNOSIS — E1165 Type 2 diabetes mellitus with hyperglycemia: Secondary | ICD-10-CM | POA: Diagnosis present

## 2021-09-08 DIAGNOSIS — F17210 Nicotine dependence, cigarettes, uncomplicated: Secondary | ICD-10-CM | POA: Diagnosis present

## 2021-09-08 DIAGNOSIS — K82A1 Gangrene of gallbladder in cholecystitis: Secondary | ICD-10-CM | POA: Diagnosis present

## 2021-09-08 DIAGNOSIS — Z6841 Body Mass Index (BMI) 40.0 and over, adult: Secondary | ICD-10-CM

## 2021-09-08 DIAGNOSIS — Z9049 Acquired absence of other specified parts of digestive tract: Secondary | ICD-10-CM

## 2021-09-08 DIAGNOSIS — K810 Acute cholecystitis: Principal | ICD-10-CM | POA: Diagnosis present

## 2021-09-08 DIAGNOSIS — J45990 Exercise induced bronchospasm: Secondary | ICD-10-CM | POA: Diagnosis present

## 2021-09-08 DIAGNOSIS — Z91018 Allergy to other foods: Secondary | ICD-10-CM

## 2021-09-08 DIAGNOSIS — Z8632 Personal history of gestational diabetes: Secondary | ICD-10-CM

## 2021-09-08 DIAGNOSIS — R079 Chest pain, unspecified: Secondary | ICD-10-CM

## 2021-09-08 DIAGNOSIS — Z91041 Radiographic dye allergy status: Secondary | ICD-10-CM

## 2021-09-08 DIAGNOSIS — K819 Cholecystitis, unspecified: Principal | ICD-10-CM

## 2021-09-08 DIAGNOSIS — Z833 Family history of diabetes mellitus: Secondary | ICD-10-CM

## 2021-09-08 LAB — COMPREHENSIVE METABOLIC PANEL
ALT: 15 U/L (ref 0–44)
AST: 13 U/L — ABNORMAL LOW (ref 15–41)
Albumin: 3.4 g/dL — ABNORMAL LOW (ref 3.5–5.0)
Alkaline Phosphatase: 91 U/L (ref 38–126)
Anion gap: 10 (ref 5–15)
BUN: 7 mg/dL (ref 6–20)
CO2: 22 mmol/L (ref 22–32)
Calcium: 8.7 mg/dL — ABNORMAL LOW (ref 8.9–10.3)
Chloride: 98 mmol/L (ref 98–111)
Creatinine, Ser: 0.55 mg/dL (ref 0.44–1.00)
GFR, Estimated: >60 mL/min (ref >60)
Glucose, Bld: 252 mg/dL — ABNORMAL HIGH (ref 70–99)
Potassium: 3.7 mmol/L (ref 3.5–5.1)
Sodium: 130 mmol/L — ABNORMAL LOW (ref 135–145)
Total Bilirubin: 0.9 mg/dL (ref 0.3–1.2)
Total Protein: 7.5 g/dL (ref 6.5–8.1)

## 2021-09-08 LAB — URINALYSIS, ROUTINE W REFLEX MICROSCOPIC
Bilirubin Urine: NEGATIVE
Glucose, UA: >=500 mg/dL — AB
Hgb urine dipstick: NEGATIVE
Ketones, ur: 20 mg/dL — AB
Leukocytes,Ua: NEGATIVE
Nitrite: NEGATIVE
Protein, ur: NEGATIVE mg/dL
Specific Gravity, Urine: 1.011 (ref 1.005–1.030)
pH: 5 (ref 5.0–8.0)

## 2021-09-08 LAB — CBC WITH DIFFERENTIAL/PLATELET
Abs Immature Granulocytes: 0.07 10*3/uL (ref 0.00–0.07)
Basophils Absolute: 0.1 10*3/uL (ref 0.0–0.1)
Basophils Relative: 0 %
Eosinophils Absolute: 0.1 10*3/uL (ref 0.0–0.5)
Eosinophils Relative: 0 %
HCT: 41.9 % (ref 36.0–46.0)
Hemoglobin: 13.2 g/dL (ref 12.0–15.0)
Immature Granulocytes: 0 %
Lymphocytes Relative: 11 %
Lymphs Abs: 1.7 10*3/uL (ref 0.7–4.0)
MCH: 20.8 pg — ABNORMAL LOW (ref 26.0–34.0)
MCHC: 31.5 g/dL (ref 30.0–36.0)
MCV: 66 fL — ABNORMAL LOW (ref 80.0–100.0)
Monocytes Absolute: 0.9 10*3/uL (ref 0.1–1.0)
Monocytes Relative: 6 %
Neutro Abs: 13.6 10*3/uL — ABNORMAL HIGH (ref 1.7–7.7)
Neutrophils Relative %: 83 %
Platelets: 378 10*3/uL (ref 150–400)
RBC: 6.35 MIL/uL — ABNORMAL HIGH (ref 3.87–5.11)
RDW: 17.3 % — ABNORMAL HIGH (ref 11.5–15.5)
WBC: 16.4 10*3/uL — ABNORMAL HIGH (ref 4.0–10.5)
nRBC: 0 % (ref 0.0–0.2)

## 2021-09-08 LAB — GLUCOSE, CAPILLARY: Glucose-Capillary: 206 mg/dL — ABNORMAL HIGH (ref 70–99)

## 2021-09-08 LAB — LIPASE, BLOOD: Lipase: 24 U/L (ref 11–51)

## 2021-09-08 LAB — PREGNANCY, URINE: Preg Test, Ur: NEGATIVE

## 2021-09-08 MED ORDER — PROCHLORPERAZINE MALEATE 10 MG PO TABS: 10.0000 mg | ORAL_TABLET | Freq: Four times a day (QID) | ORAL | Status: DC | PRN

## 2021-09-08 MED ORDER — INSULIN ASPART 100 UNIT/ML IJ SOLN
4.0000 [IU] | Freq: Three times a day (TID) | INTRAMUSCULAR | Status: DC
  Administered 2021-09-09 – 2021-09-10 (×3): 4 [IU] via SUBCUTANEOUS
  Filled 2021-09-08 (×2): qty 1

## 2021-09-08 MED ORDER — OXYCODONE HCL 5 MG PO TABS
5.0000 mg | ORAL_TABLET | ORAL | Status: DC | PRN
  Administered 2021-09-10 – 2021-09-11 (×3): 5 mg via ORAL
  Filled 2021-09-08 (×3): qty 1

## 2021-09-08 MED ORDER — INSULIN ASPART 100 UNIT/ML IJ SOLN
0.0000 [IU] | Freq: Three times a day (TID) | INTRAMUSCULAR | Status: DC
  Administered 2021-09-09: 5 [IU] via SUBCUTANEOUS
  Administered 2021-09-09: 7 [IU] via SUBCUTANEOUS
  Administered 2021-09-10 (×2): 5 [IU] via SUBCUTANEOUS
  Filled 2021-09-08 (×4): qty 1

## 2021-09-08 MED ORDER — ONDANSETRON HCL 4 MG/2ML IJ SOLN
4.0000 mg | Freq: Once | INTRAMUSCULAR | Status: AC
  Administered 2021-09-08: 4 mg via INTRAVENOUS
  Filled 2021-09-08: qty 2

## 2021-09-08 MED ORDER — SODIUM CHLORIDE 0.9 % IV BOLUS
1000.0000 mL | Freq: Once | INTRAVENOUS | Status: AC
  Administered 2021-09-08: 1000 mL via INTRAVENOUS

## 2021-09-08 MED ORDER — ONDANSETRON 4 MG PO TBDP: 4.0000 mg | ORAL_TABLET | Freq: Four times a day (QID) | ORAL | Status: DC | PRN

## 2021-09-08 MED ORDER — DIPHENHYDRAMINE HCL 50 MG/ML IJ SOLN: 12.5000 mg | Freq: Four times a day (QID) | INTRAMUSCULAR | Status: DC | PRN

## 2021-09-08 MED ORDER — CEFTRIAXONE SODIUM 1 G IJ SOLR
1.0000 g | Freq: Once | INTRAMUSCULAR | Status: AC
  Administered 2021-09-08: 1 g via INTRAVENOUS
  Filled 2021-09-08: qty 10

## 2021-09-08 MED ORDER — HEPARIN SODIUM (PORCINE) 5000 UNIT/ML IJ SOLN
5000.0000 [IU] | Freq: Three times a day (TID) | INTRAMUSCULAR | Status: DC
  Administered 2021-09-09 – 2021-09-11 (×5): 5000 [IU] via SUBCUTANEOUS
  Filled 2021-09-08 (×5): qty 1

## 2021-09-08 MED ORDER — ONDANSETRON HCL 4 MG/2ML IJ SOLN: 4.0000 mg | Freq: Four times a day (QID) | INTRAMUSCULAR | Status: DC | PRN

## 2021-09-08 MED ORDER — MELATONIN 5 MG PO TABS: 5.0000 mg | ORAL_TABLET | Freq: Every evening | ORAL | Status: DC | PRN

## 2021-09-08 MED ORDER — SODIUM CHLORIDE 0.9 % IV SOLN
2.0000 g | INTRAVENOUS | Status: DC
  Filled 2021-09-08: qty 20

## 2021-09-08 MED ORDER — ACETAMINOPHEN 500 MG PO TABS
1000.0000 mg | ORAL_TABLET | Freq: Four times a day (QID) | ORAL | Status: DC
  Administered 2021-09-08 – 2021-09-11 (×10): 1000 mg via ORAL
  Filled 2021-09-08 (×11): qty 2

## 2021-09-08 MED ORDER — ALUM & MAG HYDROXIDE-SIMETH 200-200-20 MG/5ML PO SUSP
30.0000 mL | Freq: Once | ORAL | Status: AC
  Administered 2021-09-08: 30 mL via ORAL
  Filled 2021-09-08: qty 30

## 2021-09-08 MED ORDER — HYDROMORPHONE HCL 1 MG/ML IJ SOLN
1.0000 mg | Freq: Once | INTRAMUSCULAR | Status: AC
  Administered 2021-09-08: 1 mg via INTRAVENOUS
  Filled 2021-09-08: qty 1

## 2021-09-08 MED ORDER — PANTOPRAZOLE SODIUM 40 MG IV SOLR
40.0000 mg | Freq: Every day | INTRAVENOUS | Status: DC
  Administered 2021-09-08 – 2021-09-10 (×3): 40 mg via INTRAVENOUS
  Filled 2021-09-08 (×3): qty 10

## 2021-09-08 MED ORDER — SODIUM CHLORIDE 0.9 % IV SOLN: INTRAVENOUS | Status: DC

## 2021-09-08 MED ORDER — HYDROMORPHONE HCL 1 MG/ML IJ SOLN
1.0000 mg | INTRAMUSCULAR | Status: DC | PRN
  Administered 2021-09-09 (×3): 1 mg via INTRAVENOUS
  Filled 2021-09-08 (×3): qty 1

## 2021-09-08 MED ORDER — INSULIN ASPART 100 UNIT/ML IJ SOLN
0.0000 [IU] | Freq: Every day | INTRAMUSCULAR | Status: DC
  Administered 2021-09-09: 4 [IU] via SUBCUTANEOUS
  Filled 2021-09-08 (×2): qty 1

## 2021-09-08 MED ORDER — PROCHLORPERAZINE EDISYLATE 10 MG/2ML IJ SOLN: 5.0000 mg | Freq: Four times a day (QID) | INTRAMUSCULAR | Status: DC | PRN

## 2021-09-08 MED ORDER — KETOROLAC TROMETHAMINE 30 MG/ML IJ SOLN
30.0000 mg | Freq: Four times a day (QID) | INTRAMUSCULAR | Status: DC | PRN
  Administered 2021-09-10 – 2021-09-11 (×2): 30 mg via INTRAVENOUS
  Filled 2021-09-08 (×2): qty 1

## 2021-09-08 MED ORDER — DIPHENHYDRAMINE HCL 12.5 MG/5ML PO ELIX: 12.5000 mg | ORAL_SOLUTION | Freq: Four times a day (QID) | ORAL | Status: DC | PRN

## 2021-09-08 MED ORDER — METRONIDAZOLE 500 MG/100ML IV SOLN
500.0000 mg | Freq: Three times a day (TID) | INTRAVENOUS | Status: DC
  Administered 2021-09-08 – 2021-09-09 (×3): 500 mg via INTRAVENOUS
  Filled 2021-09-08 (×4): qty 100

## 2021-09-08 NOTE — ED Notes (Signed)
This RN will return to pt room to give dilaudid since pt will be admitted for surgery.

## 2021-09-08 NOTE — ED Notes (Signed)
Provider at bedside to explain U/S findings.

## 2021-09-08 NOTE — ED Notes (Signed)
Pt states came for upper abdominal pain "throbbing"8/10, upper back pain between shoulder blades "burning" 10/10, had ultrasound of gallbladder already, states is allergic to IV contrast, declines IV diluaudid because drove self. EDP informed for alternate pain med.

## 2021-09-08 NOTE — ED Provider Notes (Signed)
[facility] Provider Note    Event Date/Time   First MD Initiated Contact with Patient 09/08/21 1406     (approximate)   History   Chief Complaint Chest Pain   HPI Melissa Mahoney is a 41 y.o. female, history of asthma, gestational diabetes, presents to the emergency department for evaluation of epigastric pain x3 days.  Patient states that she has been experiencing a burning, achy sensation in her epigastric and right upper quadrant region with intermittent episodes of burning and tightness in her chest.  Reports increased belching, bloating, and heartburn.  Recently ate buffet food a few days ago and believes it may be associated with that.  She states that she takes ibuprofen frequently for pain.  Endorses 1 episode of vomiting this morning, nonbloody/nonbilious.  Denies fever/chills, shortness of breath, diarrhea, hematochezia, hematemesis, dizziness/lightheadedness, pain/numbness in upper or lower extremities, or leg swelling.  History Limitations: No limitations.        Physical Exam  Triage Vital Signs: ED Triage Vitals  Enc Vitals Group     BP 09/08/21 1324 135/87     Pulse Rate 09/08/21 1324 100     Resp 09/08/21 1324 16     Temp 09/08/21 1324 98.4 F (36.9 C)     Temp Source 09/08/21 1324 Oral     SpO2 09/08/21 1324 96 %     Weight 09/08/21 1325 280 lb (127 kg)     Height 09/08/21 1325 5\' 7"  (1.702 m)     Head Circumference --      Peak Flow --      Pain Score 09/08/21 1324 10     Pain Loc --      Pain Edu? --      Excl. in [place]? --     Most recent vital signs: Vitals:   09/08/21 1615 09/08/21 1700  BP: 128/82 122/77  Pulse: 88 94  Resp: 16   Temp:  98.3 F (36.8 C)  SpO2: 96% 99%    General: Awake, NAD.  Skin: Warm, dry. No rashes or lesions.  Eyes: PERRL. Conjunctivae normal.  CV: Good peripheral perfusion.  Resp: Normal effort.  Neuro: At baseline. No gross neurological deficits.   Focused Exam: Tenderness in the  epigastric and right upper quadrant.  No distention present. Positive Murphy sign.   Physical Exam    ED Results / Procedures / Treatments  Labs (all labs ordered are listed, but only abnormal results are displayed) Labs Reviewed  COMPREHENSIVE METABOLIC PANEL - Abnormal; Notable for the following components:      Result Value   Sodium 130 (*)    Glucose, Bld 252 (*)    Calcium 8.7 (*)    Albumin 3.4 (*)    AST 13 (*)    All other components within normal limits  CBC WITH DIFFERENTIAL/PLATELET - Abnormal; Notable for the following components:   WBC 16.4 (*)    RBC 6.35 (*)    MCV 66.0 (*)    MCH 20.8 (*)    RDW 17.3 (*)    Neutro Abs 13.6 (*)    All other components within normal limits  URINALYSIS, ROUTINE W REFLEX MICROSCOPIC - Abnormal; Notable for the following components:   Color, Urine YELLOW (*)    APPearance HAZY (*)    Glucose, UA >=500 (*)    Ketones, ur 20 (*)    Bacteria, UA RARE (*)    All other components within normal limits  LIPASE, BLOOD  PREGNANCY,  URINE  HIV ANTIBODY (ROUTINE TESTING W REFLEX)  HEMOGLOBIN A1C  CBC  CREATININE, SERUM     EKG Sinus rhythm, rate of 99, no ST segment changes, no AV blocks, normal QRS interval, no QT prolongation.   RADIOLOGY  ED Provider Interpretation: N/A.  US Abdomen Limited RUQ (LIVER/GB)  Result Date: 09/08/2021 CLINICAL DATA:  Pain EXAM: ULTRASOUND ABDOMEN LIMITED RIGHT UPPER QUADRANT COMPARISON:  06/29/2007 FINDINGS: Gallbladder: Multiple gallstones are seen. Gallbladder is not distended. There is wall thickening and gallbladder. There is increased vascularity in the gallbladder wall. Technologist observed focal tenderness over the gallbladder. Common bile duct: Diameter: 2.8 mm Liver: No focal lesion identified. Within normal limits in parenchymal echogenicity. Portal vein is patent on color Doppler imaging with normal direction of blood flow towards the liver. Other: None. IMPRESSION: Gallbladder stones.  There is abnormal wall thickening in gallbladder along with possible trace amount of pericholecystic fluid. Technologist observed tenderness over the gallbladder. Findings suggest possible acute cholecystitis. There is no significant dilation of visualized proximal course of the common bile duct. Electronically Signed   By: Ernie Avena M.D.   On: 09/08/2021 14:26    PROCEDURES:  Critical Care performed: N/A.  Procedures    MEDICATIONS ORDERED IN ED: Medications  metroNIDAZOLE (FLAGYL) IVPB 500 mg (500 mg Intravenous New Bag/Given 09/08/21 1716)  heparin injection 5,000 Units (has no administration in time range)  0.9 %  sodium chloride infusion (has no administration in time range)  cefTRIAXone (ROCEPHIN) 2 g in sodium chloride 0.9 % 100 mL IVPB (has no administration in time range)  acetaminophen (TYLENOL) tablet 1,000 mg (has no administration in time range)  ketorolac (TORADOL) 30 MG/ML injection 30 mg (has no administration in time range)  HYDROmorphone (DILAUDID) injection 1 mg (has no administration in time range)  oxyCODONE (Oxy IR/ROXICODONE) immediate release tablet 5 mg (has no administration in time range)  ondansetron (ZOFRAN-ODT) disintegrating tablet 4 mg (has no administration in time range)    Or  ondansetron (ZOFRAN) injection 4 mg (has no administration in time range)  prochlorperazine (COMPAZINE) tablet 10 mg (has no administration in time range)    Or  prochlorperazine (COMPAZINE) injection 5-10 mg (has no administration in time range)  pantoprazole (PROTONIX) injection 40 mg (has no administration in time range)  diphenhydrAMINE (BENADRYL) 12.5 MG/5ML elixir 12.5 mg (has no administration in time range)    Or  diphenhydrAMINE (BENADRYL) injection 12.5 mg (has no administration in time range)  melatonin tablet 5 mg (has no administration in time range)  insulin aspart (novoLOG) injection 0-15 Units (has no administration in time range)  insulin aspart  (novoLOG) injection 4 Units (has no administration in time range)  insulin aspart (novoLOG) injection 0-5 Units (has no administration in time range)  sodium chloride 0.9 % bolus 1,000 mL (0 mLs Intravenous Stopped 09/08/21 1719)  ondansetron (ZOFRAN) injection 4 mg (4 mg Intravenous Given 09/08/21 1609)  alum & mag hydroxide-simeth (MAALOX/MYLANTA) 200-200-20 MG/5ML suspension 30 mL (30 mLs Oral Given 09/08/21 1555)  HYDROmorphone (DILAUDID) injection 1 mg (1 mg Intravenous Given 09/08/21 1648)  cefTRIAXone (ROCEPHIN) 1 g in sodium chloride 0.9 % 100 mL IVPB (0 g Intravenous Stopped 09/08/21 1715)     IMPRESSION / MDM / ASSESSMENT AND PLAN / ED COURSE  I reviewed the triage vital signs and the nursing notes.  Differential diagnosis includes, but is not limited to, gastric/peptic ulcers, GERD, cholecystitis, cholelithiasis, colitis, gastroenteritis, cholangitis.  ED Course Patient peers well, vitals within normal limits.  NAD.  We will go ahead treat symptoms with GI cocktail, IV fluids, and ondansetron.  CBC shows leukocytosis at 16.4, no anemia present.  CMP notable for mild hyponatremia at 130 and elevated glucose at 252.   Urinalysis notable for elevated glucose, otherwise no evidence of UTI.  Urine pregnancy negative.  Assessment/Plan Patient presents with epigastric/right upper quadrant pain x3 days in the setting of recent ingestion of buffet food.  Physical exam notable for positive Murphy sign.  Labs show leukocytosis at 16.4.  Right upper quadrant ultrasound does show multiple gallstones in the gallbladder with evidence of possible acute cholecystitis.  See above for details.  Spoke with the on-call surgeon, Dr. Everlene Farrier, who advised that he would admit the patient and plan to operate on the patient tomorrow.  We will go ahead and place patient on n.p.o. status and treat patient with analgesics and antibiotics.  Patient care transferred over to Dr. Rushie Nyhan,    Patient's presentation is most consistent with acute presentation with potential threat to life or bodily function.      FINAL CLINICAL IMPRESSION(S) / ED DIAGNOSES   Final diagnoses:  Cholecystitis     Rx / DC Orders   ED Discharge Orders     None        Note:  This document was prepared using Dragon voice recognition software and may include unintentional dictation errors.   Varney Daily, [place] 09/08/21 1749    Merwyn Katos, MD 09/08/21 [phone]

## 2021-09-08 NOTE — ED Provider Triage Note (Signed)
Emergency Medicine Provider Triage Evaluation Note  Melissa Mahoney , a 41 y.o. female  was evaluated in triage.  Pt complains of upper abdominal pain and anterior chest pain. Pain also goes into right upper back.  States that she ate a [employer] and has had abdominal and chest pain since(3 days).  Vomited once this am.    Review of Systems  Positive: +burping, gas,  small BM this am. Hx of HTN.  Negative: No sob, no fever,   Physical Exam  There were no vitals taken for this visit. Gen:   Awake, no distress   Resp:  Normal effort  MSK:   Moves extremities without difficulty  Other:    Medical Decision Making  Medically screening exam initiated at 1:18 PM.  Appropriate orders placed.  Melissa Mahoney was informed that the remainder of the evaluation will be completed by another provider, this initial triage assessment does not replace that evaluation, and the importance of remaining in the ED until their evaluation is complete.     Tommi Rumps, PA-C 09/08/21 1327

## 2021-09-08 NOTE — ED Triage Notes (Signed)
Patient c/o burning, achy, and tight chest pain for a few days with radiation to back and epigastric area.

## 2021-09-09 ENCOUNTER — Observation Stay: Payer: Self-pay | Admitting: Anesthesiology

## 2021-09-09 ENCOUNTER — Encounter: Payer: Self-pay | Admitting: Surgery

## 2021-09-09 ENCOUNTER — Other Ambulatory Visit: Payer: Self-pay

## 2021-09-09 ENCOUNTER — Encounter
Admission: EM | Disposition: A | Discharge status: Home / Self Care | Payer: Self-pay | Source: Home / Self Care | Attending: Surgery

## 2021-09-09 DIAGNOSIS — K8000 Calculus of gallbladder with acute cholecystitis without obstruction: Secondary | ICD-10-CM

## 2021-09-09 DIAGNOSIS — K810 Acute cholecystitis: Secondary | ICD-10-CM

## 2021-09-09 LAB — HEMOGLOBIN A1C
Hgb A1c MFr Bld: 11.2 % — ABNORMAL HIGH (ref 4.8–5.6)
Mean Plasma Glucose: 274.74 mg/dL

## 2021-09-09 LAB — GLUCOSE, CAPILLARY
Glucose-Capillary: 204 mg/dL — ABNORMAL HIGH (ref 70–99)
Glucose-Capillary: 257 mg/dL — ABNORMAL HIGH (ref 70–99)
Glucose-Capillary: 267 mg/dL — ABNORMAL HIGH (ref 70–99)
Glucose-Capillary: 290 mg/dL — ABNORMAL HIGH (ref 70–99)
Glucose-Capillary: 344 mg/dL — ABNORMAL HIGH (ref 70–99)

## 2021-09-09 LAB — HIV ANTIBODY (ROUTINE TESTING W REFLEX): HIV Screen 4th Generation wRfx: NONREACTIVE

## 2021-09-09 SURGERY — CHOLECYSTECTOMY, ROBOT-ASSISTED, LAPAROSCOPIC
Anesthesia: General | Site: Abdomen

## 2021-09-09 MED ORDER — KETOROLAC TROMETHAMINE 30 MG/ML IJ SOLN
INTRAMUSCULAR | Status: AC
  Filled 2021-09-09: qty 1

## 2021-09-09 MED ORDER — PIPERACILLIN-TAZOBACTAM 3.375 G IVPB 30 MIN
3.3750 g | Freq: Three times a day (TID) | INTRAVENOUS | Status: DC
  Administered 2021-09-09 – 2021-09-11 (×6): 3.375 g via INTRAVENOUS
  Filled 2021-09-09 (×13): qty 50

## 2021-09-09 MED ORDER — PHENYLEPHRINE 80 MCG/ML (10ML) SYRINGE FOR IV PUSH (FOR BLOOD PRESSURE SUPPORT)
PREFILLED_SYRINGE | INTRAVENOUS | Status: DC | PRN
  Administered 2021-09-09 (×2): 160 ug via INTRAVENOUS

## 2021-09-09 MED ORDER — SUGAMMADEX SODIUM 500 MG/5ML IV SOLN
INTRAVENOUS | Status: DC | PRN
  Administered 2021-09-09: 250 mg via INTRAVENOUS

## 2021-09-09 MED ORDER — INDOCYANINE GREEN 25 MG IV SOLR
2.5000 mg | Freq: Once | INTRAVENOUS | Status: AC
  Administered 2021-09-09: 2.5 mg via TOPICAL
  Filled 2021-09-09: qty 1

## 2021-09-09 MED ORDER — FENTANYL CITRATE (PF) 100 MCG/2ML IJ SOLN
25.0000 ug | INTRAMUSCULAR | Status: DC | PRN
  Administered 2021-09-09 (×2): 25 ug via INTRAVENOUS

## 2021-09-09 MED ORDER — BUPIVACAINE LIPOSOME 1.3 % IJ SUSP
INTRAMUSCULAR | Status: DC | PRN
  Administered 2021-09-09: 20 mL

## 2021-09-09 MED ORDER — CEFAZOLIN SODIUM-DEXTROSE 2-3 GM-%(50ML) IV SOLR
INTRAVENOUS | Status: DC | PRN
  Administered 2021-09-09: 2 g via INTRAVENOUS

## 2021-09-09 MED ORDER — LACTATED RINGERS IV SOLN: INTRAVENOUS | Status: DC | PRN

## 2021-09-09 MED ORDER — PROMETHAZINE HCL 25 MG/ML IJ SOLN: 6.2500 mg | INTRAMUSCULAR | Status: DC | PRN

## 2021-09-09 MED ORDER — FENTANYL CITRATE (PF) 100 MCG/2ML IJ SOLN
INTRAMUSCULAR | Status: AC
  Filled 2021-09-09: qty 2

## 2021-09-09 MED ORDER — FENTANYL CITRATE (PF) 250 MCG/5ML IJ SOLN
INTRAMUSCULAR | Status: AC
  Filled 2021-09-09: qty 5

## 2021-09-09 MED ORDER — FENTANYL CITRATE (PF) 100 MCG/2ML IJ SOLN
INTRAMUSCULAR | Status: DC | PRN
  Administered 2021-09-09: 150 ug via INTRAVENOUS
  Administered 2021-09-09: 100 ug via INTRAVENOUS

## 2021-09-09 MED ORDER — MIDAZOLAM HCL 2 MG/2ML IJ SOLN
INTRAMUSCULAR | Status: AC
  Filled 2021-09-09: qty 2

## 2021-09-09 MED ORDER — KETOROLAC TROMETHAMINE 30 MG/ML IJ SOLN
INTRAMUSCULAR | Status: DC | PRN
  Administered 2021-09-09: 30 mg via INTRAVENOUS

## 2021-09-09 MED ORDER — DEXAMETHASONE SODIUM PHOSPHATE 10 MG/ML IJ SOLN
INTRAMUSCULAR | Status: DC | PRN
  Administered 2021-09-09: 10 mg via INTRAVENOUS

## 2021-09-09 MED ORDER — DEXAMETHASONE SODIUM PHOSPHATE 10 MG/ML IJ SOLN
INTRAMUSCULAR | Status: AC
  Filled 2021-09-09: qty 1

## 2021-09-09 MED ORDER — ROCURONIUM BROMIDE 10 MG/ML (PF) SYRINGE
PREFILLED_SYRINGE | INTRAVENOUS | Status: AC
  Filled 2021-09-09: qty 10

## 2021-09-09 MED ORDER — MIDAZOLAM HCL 2 MG/2ML IJ SOLN
INTRAMUSCULAR | Status: DC | PRN
  Administered 2021-09-09: 2 mg via INTRAVENOUS

## 2021-09-09 MED ORDER — SODIUM CHLORIDE 0.9 % IV SOLN
INTRAVENOUS | Status: AC | PRN
  Administered 2021-09-09 (×2): 1000 mL

## 2021-09-09 MED ORDER — 0.9 % SODIUM CHLORIDE (POUR BTL) OPTIME
TOPICAL | Status: DC | PRN
  Administered 2021-09-09: 500 mL

## 2021-09-09 MED ORDER — SUCCINYLCHOLINE CHLORIDE 200 MG/10ML IV SOSY
PREFILLED_SYRINGE | INTRAVENOUS | Status: DC | PRN
  Administered 2021-09-09: 140 mg via INTRAVENOUS

## 2021-09-09 MED ORDER — PROPOFOL 10 MG/ML IV BOLUS
INTRAVENOUS | Status: AC
  Filled 2021-09-09: qty 40

## 2021-09-09 MED ORDER — SUGAMMADEX SODIUM 500 MG/5ML IV SOLN
INTRAVENOUS | Status: AC
  Filled 2021-09-09: qty 5

## 2021-09-09 MED ORDER — BUPIVACAINE-EPINEPHRINE (PF) 0.25% -1:200000 IJ SOLN
INTRAMUSCULAR | Status: DC | PRN
  Administered 2021-09-09: 30 mL via PERINEURAL

## 2021-09-09 MED ORDER — ROCURONIUM BROMIDE 100 MG/10ML IV SOLN
INTRAVENOUS | Status: DC | PRN
  Administered 2021-09-09: 60 mg via INTRAVENOUS

## 2021-09-09 MED ORDER — ONDANSETRON HCL 4 MG/2ML IJ SOLN
INTRAMUSCULAR | Status: AC
  Filled 2021-09-09: qty 2

## 2021-09-09 MED ORDER — ONDANSETRON HCL 4 MG/2ML IJ SOLN
INTRAMUSCULAR | Status: DC | PRN
  Administered 2021-09-09: 4 mg via INTRAVENOUS

## 2021-09-09 MED ORDER — KETAMINE HCL 10 MG/ML IJ SOLN
INTRAMUSCULAR | Status: DC | PRN
  Administered 2021-09-09: 20 mg via INTRAVENOUS

## 2021-09-09 MED ORDER — DEXMEDETOMIDINE (PRECEDEX) IN NS 20 MCG/5ML (4 MCG/ML) IV SYRINGE
PREFILLED_SYRINGE | INTRAVENOUS | Status: DC | PRN
  Administered 2021-09-09: 12 ug via INTRAVENOUS
  Administered 2021-09-09 (×2): 8 ug via INTRAVENOUS

## 2021-09-09 MED ORDER — LIDOCAINE HCL (PF) 2 % IJ SOLN
INTRAMUSCULAR | Status: AC
  Filled 2021-09-09: qty 5

## 2021-09-09 MED ORDER — PROPOFOL 10 MG/ML IV BOLUS
INTRAVENOUS | Status: DC | PRN
  Administered 2021-09-09: 200 mg via INTRAVENOUS

## 2021-09-09 MED ORDER — KETAMINE HCL 50 MG/5ML IJ SOSY
PREFILLED_SYRINGE | INTRAMUSCULAR | Status: AC
  Filled 2021-09-09: qty 5

## 2021-09-09 MED ORDER — LIDOCAINE HCL (CARDIAC) PF 100 MG/5ML IV SOSY
PREFILLED_SYRINGE | INTRAVENOUS | Status: DC | PRN
  Administered 2021-09-09: 100 mg via INTRAVENOUS

## 2021-09-09 SURGICAL SUPPLY — 52 items
BULB RESERV EVAC DRAIN JP 100C (MISCELLANEOUS) ×1 IMPLANT
CANNULA REDUC XI 12-8 STAPL (CANNULA) ×1
CANNULA REDUCER 12-8 DVNC XI (CANNULA) ×2 IMPLANT
CLIP LIGATING HEMO O LOK GREEN (MISCELLANEOUS) ×4 IMPLANT
DERMABOND ADVANCED (GAUZE/BANDAGES/DRESSINGS) ×1
DERMABOND ADVANCED .7 DNX12 (GAUZE/BANDAGES/DRESSINGS) ×2 IMPLANT
DRAIN CHANNEL JP 19F (MISCELLANEOUS) ×1 IMPLANT
DRAPE ARM DVNC X/XI (DISPOSABLE) ×8 IMPLANT
DRAPE COLUMN DVNC XI (DISPOSABLE) ×2 IMPLANT
DRAPE DA VINCI XI ARM (DISPOSABLE) ×4
DRAPE DA VINCI XI COLUMN (DISPOSABLE) ×1
DRSG TEGADERM 4X4.75 (GAUZE/BANDAGES/DRESSINGS) ×1 IMPLANT
ELECT CAUTERY BLADE 6.4 (BLADE) ×3 IMPLANT
ELECT REM PT RETURN 9FT ADLT (ELECTROSURGICAL) ×3
ELECTRODE REM PT RTRN 9FT ADLT (ELECTROSURGICAL) ×2 IMPLANT
GLOVE BIO SURGEON STRL SZ7 (GLOVE) ×7 IMPLANT
GOWN STRL REUS W/ TWL LRG LVL3 (GOWN DISPOSABLE) ×8 IMPLANT
GOWN STRL REUS W/TWL LRG LVL3 (GOWN DISPOSABLE) ×5
IRRIGATION STRYKERFLOW (MISCELLANEOUS) IMPLANT
IRRIGATOR STRYKERFLOW (MISCELLANEOUS) ×3
IV NS 1000ML (IV SOLUTION) ×2
IV NS 1000ML BAXH (IV SOLUTION) IMPLANT
KIT PINK PAD W/HEAD ARE REST (MISCELLANEOUS) ×3 IMPLANT
KIT PINK PAD W/HEAD ARM REST (MISCELLANEOUS) ×2 IMPLANT
LABEL OR SOLS (LABEL) ×3 IMPLANT
MANIFOLD NEPTUNE II (INSTRUMENTS) ×3 IMPLANT
NEEDLE HYPO 22GX1.5 SAFETY (NEEDLE) ×3 IMPLANT
NS IRRIG 500ML POUR BTL (IV SOLUTION) ×3 IMPLANT
OBTURATOR OPTICAL STANDARD 8MM (TROCAR) ×1
OBTURATOR OPTICAL STND 8 DVNC (TROCAR) ×2
OBTURATOR OPTICALSTD 8 DVNC (TROCAR) ×2 IMPLANT
PACK LAP CHOLECYSTECTOMY (MISCELLANEOUS) ×3 IMPLANT
PENCIL SMOKE EVACUATOR (MISCELLANEOUS) ×1 IMPLANT
SEAL CANN UNIV 5-8 DVNC XI (MISCELLANEOUS) ×6 IMPLANT
SEAL XI 5MM-8MM UNIVERSAL (MISCELLANEOUS) ×3
SET TUBE SMOKE EVAC HIGH FLOW (TUBING) ×3 IMPLANT
SOLUTION ELECTROLUBE (MISCELLANEOUS) ×3 IMPLANT
SPIKE FLUID TRANSFER (MISCELLANEOUS) ×3 IMPLANT
SPONGE DRAIN TRACH 4X4 STRL 2S (GAUZE/BANDAGES/DRESSINGS) ×1 IMPLANT
SPONGE T-LAP 18X18 ~~LOC~~+RFID (SPONGE) ×3 IMPLANT
SPONGE T-LAP 4X18 ~~LOC~~+RFID (SPONGE) ×1 IMPLANT
STAPLER CANNULA SEAL DVNC XI (STAPLE) ×2 IMPLANT
STAPLER CANNULA SEAL XI (STAPLE) ×1
SUT ETHILON 3-0 FS-10 30 BLK (SUTURE) ×3
SUT MNCRL AB 4-0 PS2 18 (SUTURE) ×3 IMPLANT
SUT VICRYL 0 AB UR-6 (SUTURE) ×7 IMPLANT
SUTURE EHLN 3-0 FS-10 30 BLK (SUTURE) IMPLANT
SYR 20ML LL LF (SYRINGE) ×3 IMPLANT
SYR 30ML LL (SYRINGE) ×3 IMPLANT
SYS BAG RETRIEVAL 10MM (BASKET) ×6
SYSTEM BAG RETRIEVAL 10MM (BASKET) ×2 IMPLANT
WATER STERILE IRR 500ML POUR (IV SOLUTION) ×3 IMPLANT

## 2021-09-09 NOTE — Op Note (Signed)
Robotic assisted laparoscopic Cholecystectomy  Pre-operative Diagnosis: Acute cholecystitis  Post-operative Diagnosis: Same  Procedure:  Robotic assisted laparoscopic Cholecystectomy  Surgeon: Sterling Big, MD FACS  Anesthesia: Gen. with endotracheal tube  Findings: Severe acute gangrenous cholecystitis  No evidence of bowel injuries or leaks Hepatic steatosis with a massive liver Very challenging case due to severe inflammatory reaction, body habitus and massive liver  Estimated Blood Loss:25 cc       Specimens: Gallbladder           Complications: none   Procedure Details  The patient was seen again in the Holding Room. The benefits, complications, treatment options, and expected outcomes were discussed with the patient. The risks of bleeding, infection, recurrence of symptoms, failure to resolve symptoms, bile duct damage, bile duct leak, retained common bile duct stone, bowel injury, any of which could require further surgery and/or ERCP, stent, or papillotomy were reviewed with the patient. The likelihood of improving the patient's symptoms with return to their baseline status is good.  The patient and/or family concurred with the proposed plan, giving informed consent.  The patient was taken to Operating Room, identified  and the procedure verified as Laparoscopic Cholecystectomy.  A Time Out was held and the above information confirmed.  Prior to the induction of general anesthesia, antibiotic prophylaxis was administered. VTE prophylaxis was in place. General endotracheal anesthesia was then administered and tolerated well. After the induction, the abdomen was prepped with Chloraprep and draped in the sterile fashion. The patient was positioned in the supine position.  Cut down technique was used to enter the abdominal cavity and a Hasson trochar was placed after two vicryl stitches were anchored to the fascia. Pneumoperitoneum was then created with CO2 and tolerated well  without any adverse changes in the patient's vital signs.  Three 8-mm ports were placed under direct vision. All skin incisions  were infiltrated with a local anesthetic agent before making the incision and placing the trocars.   The patient was positioned  in reverse Trendelenburg, robot was brought to the surgical field and docked in the standard fashion.  We made sure all the instrumentation was kept indirect view at all times and that there were no collision between the arms. I scrubbed out and went to the console.  Omentum was plastered to the gallbladder.  This was divided with electrocautery.  It was severe inflammatory response and I was unable to manipulate the gallbladder.  I decompressed the gallbladder and aspirated bile. The gallbladder was identified, the fundus grasped and retracted cephalad. Adhesions were lysed bluntly.  Given that there was somewhat inflammatory response I had to divide the gallbladder within the body and extracted some stones.  I then followed the appropriate anatomy for the cystic duct.  Multiple countless stones where drain from the gallbladder   The infundibulum was grasped and retracted laterally, exposing the peritoneum overlying the triangle of Calot. This was then divided and exposed in a blunt fashion. An extended critical view of the cystic duct and cystic artery was obtained.  The cystic duct was clearly identified and bluntly dissected.   Artery and duct were double clipped and divided. Using ICG cholangiography we visualize the cystic duct and CBD, no evidence of bile injuries. The gallbladder was taken from the gallbladder fossa in a retrograde fashion with the electrocautery.  Hemostasis was achieved with the electrocautery. Inspection of the right upper quadrant was performed. No bleeding, bile duct injury or leak, or bowel injury was noted. Robotic instruments  and robotic arms were undocked in the standard fashion.  I scrubbed back in. We actually  had to use 2 bags 1 for dispensing of the gallbladder and I did an additional VAC for the stones given that this was a massive gallbladder. Given the degree of inflammation and infection I decided to place a #19 Blake drain in the gallbladder fossa with an exit site on one of the lateral right robotic ports  Pneumoperitoneum was released.  The periumbilical port site was closed with interrumpted 0 Vicryl sutures. 4-0 subcuticular Monocryl was used to close the skin. Dermabond was  applied.  The patient was then extubated and brought to the recovery room in stable condition. Sponge, lap, and needle counts were correct at closure and at the conclusion of the case.               Sterling Big, MD, FACS

## 2021-09-09 NOTE — TOC Initial Note (Signed)
Transition of Care [facility]) - Initial/Assessment Note    Patient Details  Name: Melissa Mahoney MRN: [id number] Date of Birth: 02-Dec-1980  [facility]) CM/SW Contact:    Chapman Fitch, RN Phone Number: 09/09/2021, 10:28 AM  Clinical Narrative:                  Transition of Care (TOC) Screening Note   Patient Details  Name: Melissa Mahoney Date of Birth: [birth year]   Transition of Care [facility]) CM/SW Contact:    Chapman Fitch, RN Phone Number: 09/09/2021, 10:28 AM    Transition of Care Department (TOC) has reviewed patient and no TOC needs have been identified at this time. We will continue to monitor patient advancement through interdisciplinary progression rounds. If new patient transition needs arise, please place a TOC consult.          Patient Goals and CMS Choice        Expected Discharge Plan and Services                                                Prior Living Arrangements/Services                       Activities of Daily Living Home Assistive Devices/Equipment: None ADL Screening (condition at time of admission) Patient's cognitive ability adequate to safely complete daily activities?: Yes Is the patient deaf or have difficulty hearing?: No Does the patient have difficulty seeing, even when wearing glasses/contacts?: No Does the patient have difficulty concentrating, remembering, or making decisions?: No Patient able to express need for assistance with ADLs?: Yes Does the patient have difficulty dressing or bathing?: No Independently performs ADLs?: Yes (appropriate for developmental age) Does the patient have difficulty walking or climbing stairs?: No Weakness of Legs: Both Weakness of Arms/Hands: None  Permission Sought/Granted                  Emotional Assessment              Admission diagnosis:  Cholecystitis [K81.9] Patient Active Problem List   Diagnosis Date Noted    Cholecystitis 09/08/2021   Vaginal bleeding during pregnancy 06/29/2016   PCP:  [facility]:   [facility] - [place], [place] - [address] [place] [address] Phone: [phone] Fax: [phone]     Social Determinants of Health (SDOH) Interventions    Readmission Risk Interventions     No data to display

## 2021-09-09 NOTE — Transfer of Care (Signed)
Immediate Anesthesia Transfer of Care Note  Patient: Melissa Mahoney  Procedure(s) Performed: XI ROBOTIC ASSISTED LAPAROSCOPIC CHOLECYSTECTOMY (Abdomen)  Patient Location: PACU  Anesthesia Type:General  Level of Consciousness: drowsy  Airway & Oxygen Therapy: Patient Spontanous Breathing and Patient connected to face mask oxygen  Post-op Assessment: Report given to RN and Post -op Vital signs reviewed and stable  Post vital signs: Reviewed and stable  Last Vitals:  Vitals Value Taken Time  BP 111/70 09/09/21 1336  Temp    Pulse 88 09/09/21 1340  Resp 31 09/09/21 1340  SpO2 97 % 09/09/21 1340  Vitals shown include unvalidated device data.  Last Pain:  Vitals:   09/09/21 0922  TempSrc:   PainSc: 5          Complications: No notable events documented.

## 2021-09-09 NOTE — Anesthesia Preprocedure Evaluation (Signed)
Anesthesia Evaluation  Patient identified by MRN, date of birth, ID band Patient awake    Reviewed: Allergy & Precautions, H&P , NPO status , Patient's Chart, lab work & pertinent test results, reviewed documented beta blocker date and time   History of Anesthesia Complications (+) PROLONGED EMERGENCE and history of anesthetic complications  Airway Mallampati: II  TM Distance: >3 FB Neck ROM: full    Dental  (+) Dental Advidsory Given, Missing, Chipped, Poor Dentition   Pulmonary neg shortness of breath, asthma , neg sleep apnea, neg COPD, neg recent URI, Current Smoker,    Pulmonary exam normal breath sounds clear to auscultation       Cardiovascular Exercise Tolerance: Good hypertension, (-) angina(-) Past MI and (-) Cardiac Stents Normal cardiovascular exam(-) dysrhythmias (-) Valvular Problems/Murmurs Rhythm:regular Rate:Normal     Neuro/Psych negative neurological ROS  negative psych ROS   GI/Hepatic Neg liver ROS, GERD  ,  Endo/Other  diabetesMorbid obesity  Renal/GU negative Renal ROS  negative genitourinary   Musculoskeletal   Abdominal   Peds  Hematology negative hematology ROS (+)   Anesthesia Other Findings Past Medical History: No date: Exercise-induced asthma No date: Gestational diabetes   Reproductive/Obstetrics negative OB ROS                             Anesthesia Physical Anesthesia Plan  ASA: 3  Anesthesia Plan: General   Post-op Pain Management:    Induction: Intravenous, Rapid sequence and Cricoid pressure planned  PONV Risk Score and Plan: 2 and Ondansetron, Dexamethasone, Midazolam and Treatment may vary due to age or medical condition  Airway Management Planned: Oral ETT  Additional Equipment:   Intra-op Plan:   Post-operative Plan: Extubation in OR  Informed Consent: I have reviewed the patients History and Physical, chart, labs and discussed the  procedure including the risks, benefits and alternatives for the proposed anesthesia with the patient or authorized representative who has indicated his/her understanding and acceptance.     Dental Advisory Given  Plan Discussed with: Anesthesiologist, CRNA and Surgeon  Anesthesia Plan Comments:         Anesthesia Quick Evaluation

## 2021-09-09 NOTE — H&P (Addendum)
Patient ID: Melissa Mahoney, female   DOB: 1980-12-22, 41 y.o.   MRN: [id number]  HPI Melissa Mahoney is a 40 y.o. female Melissa Mahoney is a 41 y.o. female, history of asthma, gestational diabetes, presents to the emergency department for acute RUQ and epigastric pain x3 days.  Pain is severe has been present for at least a day constantly.  Reports increased belching, bloating, and heartburn.  Recently ate buffet food a few days ago and believes it may be associated with that.  She states that she takes ibuprofen frequently for pain.  Endorses 1 episode of vomiting this morning, nonbloody/nonbilious.  Denies fever/chills, shortness of breath, diarrhea, hematochezia, hematemesis, dizziness/lightheadedness, pain/numbness in upper or lower extremities, or leg swelling. She denies any fevers or chills no evidence of biliary obstruction.  She does have a history of C-section.  She does have a high BMI of 44.  She is able to perform more than 4 METS of activity without any shortness of breath or chest pain.  She does have an allergy to iodine and reports rash and some swelling.  We will do ICD and I did explain to her that it has less than 5% content but I will make it safer to perform visualization of the common bile duct.  I do think that the benefits of ICG vastly outweighed the risk in this case I did have a discussion with the pr , family and pharmacy regarding this.    She did have an ultrasound that I personally reviewed showing evidence of multiple gallstones Some thickening of the gallbladder wall consistent with acute  Cholecystitis. She does have a white count of 16,000 with a left shift.  CMP is completely normal except a glucose of 252.  HPI  Past Medical History:  Diagnosis Date   Exercise-induced asthma    Gestational diabetes     Past Surgical History:  Procedure Laterality Date   CESAREAN SECTION     I & D EXTREMITY     NO PAST SURGERIES      Family History  Problem  Relation Age of Onset   Diabetes Paternal Aunt    Diabetes Paternal Uncle    Diabetes Paternal Grandmother    Diabetes Paternal Uncle     Social History Social History   Tobacco Use   Smoking status: Every Day    Packs/day: 0.50    Years: 10.00    Total pack years: 5.00    Types: Cigarettes   Smokeless tobacco: Never   Tobacco comments:    patient states that she is working with health department on a plan to quit  Vaping Use   Vaping Use: Never used  Substance Use Topics   Alcohol use: No   Drug use: No    Allergies  Allergen Reactions   Iodinated Contrast Media Itching and Shortness Of Breath    Shortness of breath, itchy throat and numb lips   Shellfish Allergy Swelling    Lips, hands with swelling   Banana Swelling    Lips, hands with swelling   Iodides Itching    Current Facility-Administered Medications  Medication Dose Route Frequency Provider Last Rate Last Admin   0.9 %  sodium chloride infusion   Intravenous Continuous Vikki Gains F, MD 125 mL/hr at 09/09/21 0401 Infusion Verify at 09/09/21 0401   acetaminophen (TYLENOL) tablet 1,000 mg  1,000 mg Oral Q6H Chloe Bluett F, MD   1,000 mg at 09/09/21 0023   cefTRIAXone (ROCEPHIN) 2 g in sodium  chloride 0.9 % 100 mL IVPB  2 g Intravenous Q24H Jaydalee Bardwell F, MD       diphenhydrAMINE (BENADRYL) 12.5 MG/5ML elixir 12.5 mg  12.5 mg Oral Q6H PRN Keri Veale, Clarice Bonaventure F, MD       Or   diphenhydrAMINE (BENADRYL) injection 12.5 mg  12.5 mg Intravenous Q6H PRN Cherene Dobbins, Kacper Cartlidge F, MD       heparin injection 5,000 Units  5,000 Units Subcutaneous Q8H Garielle Mroz, Lissa Rowles F, MD       HYDROmorphone (DILAUDID) injection 1 mg  1 mg Intravenous Q4H PRN Averyana Pillars, Cecelia Byars F, MD   1 mg at 09/09/21 0332   insulin aspart (novoLOG) injection 0-15 Units  0-15 Units Subcutaneous TID WC Taleeya Blondin F, MD   5 Units at 09/09/21 0921   insulin aspart (novoLOG) injection 0-5 Units  0-5 Units Subcutaneous QHS Nattaly Yebra F, MD       insulin aspart (novoLOG)  injection 4 Units  4 Units Subcutaneous TID WC Nickalus Thornsberry F, MD       ketorolac (TORADOL) 30 MG/ML injection 30 mg  30 mg Intravenous Q6H PRN Ariyan Brisendine F, MD       melatonin tablet 5 mg  5 mg Oral QHS PRN Del Wiseman F, MD       metroNIDAZOLE (FLAGYL) IVPB 500 mg  500 mg Intravenous Q8H Baylee Campus F, MD 100 mL/hr at 09/09/21 0926 500 mg at 09/09/21 0926   ondansetron (ZOFRAN-ODT) disintegrating tablet 4 mg  4 mg Oral Q6H PRN Sharolyn Weber, Merri Ray, MD       Or   ondansetron (ZOFRAN) injection 4 mg  4 mg Intravenous Q6H PRN Quanasia Defino F, MD       oxyCODONE (Oxy IR/ROXICODONE) immediate release tablet 5 mg  5 mg Oral Q4H PRN Trannie Bardales F, MD       pantoprazole (PROTONIX) injection 40 mg  40 mg Intravenous QHS Dinora Hemm, Cecelia Byars F, MD   40 mg at 09/08/21 2117   prochlorperazine (COMPAZINE) tablet 10 mg  10 mg Oral Q6H PRN Ry Moody, Merri Ray, MD       Or   prochlorperazine (COMPAZINE) injection 5-10 mg  5-10 mg Intravenous Q6H PRN Shalissa Easterwood, Merri Ray, MD         Review of Systems Full ROS  was asked and was negative except for the information on the HPI  Physical Exam Blood pressure 134/84, pulse 95, temperature 99.5 F (37.5 C), temperature source Oral, resp. rate 18, height 5\' 7"  (1.702 m), weight 127 kg, SpO2 100 %, not currently breastfeeding. CONSTITUTIONAL: NAD. BMI 44 EYES: Pupils are equal, round,  Sclera are non-icteric. EARS, NOSE, MOUTH AND THROAT:  The oral mucosa is pink and moist. Hearing is intact to voice. LYMPH NODES:  Lymph nodes in the neck are normal. RESPIRATORY:  Lungs are clear. There is normal respiratory effort, with equal breath sounds bilaterally, and without pathologic use of accessory muscles. CARDIOVASCULAR: Heart is regular without murmurs, gallops, or rubs. GI: The abdomen is  soft, TTP RUQ  + Murphy. w/o peritonitis There are no palpable masses. There is no hepatosplenomegaly. There are normal bowel sounds in all quadrants. GU: Rectal deferred.   MUSCULOSKELETAL: Normal  muscle strength and tone. No cyanosis or edema.   SKIN: Turgor is good and there are no pathologic skin lesions or ulcers. NEUROLOGIC: Motor and sensation is grossly normal. Cranial nerves are grossly intact. PSYCH:  Oriented to person, place and time. Affect is normal.  Data Reviewed  I have personally reviewed the patient's imaging, laboratory findings and medical records.    Assessment/Plan 41 year old female with acute cholecystitis.  Discussed with patient in detail about her disease process.  Definitely recommend to cholecystectomy as a treatment of choice.  She is diabetic and she is showing signs of severe cholecystitis and may complicate things quickly.  I expressed my concerns to the patient.  She did have some concerns regarding the anesthetic.  Procedure discussed with the patient in detail. The risks, benefits, complications, treatment options, and expected outcomes were discussed with the patient. The possibilities of bleeding, recurrent infection, finding a normal gallbladder, perforation of viscus organs, damage to surrounding structures, bile leak, abscess formation, needing a drain placed, the need for additional procedures, reaction to medication, pulmonary aspiration,  failure to diagnose a condition, the possible need to convert to an open procedure, and creating a complication requiring transfusion or operation were discussed with the patient. The patient and/or family concurred with the proposed plan, giving informed consent.     I spent greater than 75 minutes in this encounter including personally reviewing imaging studies, coordinating her care, placing orders, counseling the patient and performing appropriate documentation  Sterling Big, MD FACS General Surgeon 09/09/2021, 9:33 AM

## 2021-09-09 NOTE — Progress Notes (Signed)
Multiple attempts to call the family were done.  They were all unsuccessful

## 2021-09-09 NOTE — Anesthesia Procedure Notes (Signed)
Procedure Name: Intubation Date/Time: 09/09/2021 11:27 AM  Performed by: Katherine Basset, CRNAPre-anesthesia Checklist: Patient identified, Emergency Drugs available, Suction available and Patient being monitored Patient Re-evaluated:Patient Re-evaluated prior to induction Oxygen Delivery Method: Circle system utilized Preoxygenation: Pre-oxygenation with 100% oxygen Induction Type: IV induction, Rapid sequence and Cricoid Pressure applied Ventilation: Mask ventilation without difficulty and Oral airway inserted - appropriate to patient size Laryngoscope Size: McGraph and 3 Grade View: Grade I Tube type: Oral Number of attempts: 1 Airway Equipment and Method: Stylet, Oral airway and Bite block Placement Confirmation: ETT inserted through vocal cords under direct vision, positive ETCO2 and breath sounds checked- equal and bilateral Secured at: 21 cm Tube secured with: Tape Dental Injury: Teeth and Oropharynx as per pre-operative assessment

## 2021-09-10 DIAGNOSIS — Z9049 Acquired absence of other specified parts of digestive tract: Secondary | ICD-10-CM

## 2021-09-10 DIAGNOSIS — E1165 Type 2 diabetes mellitus with hyperglycemia: Secondary | ICD-10-CM

## 2021-09-10 DIAGNOSIS — R079 Chest pain, unspecified: Secondary | ICD-10-CM

## 2021-09-10 DIAGNOSIS — J45990 Exercise induced bronchospasm: Secondary | ICD-10-CM

## 2021-09-10 LAB — CBC
HCT: 36.5 % (ref 36.0–46.0)
Hemoglobin: 11.3 g/dL — ABNORMAL LOW (ref 12.0–15.0)
MCH: 20.8 pg — ABNORMAL LOW (ref 26.0–34.0)
MCHC: 31 g/dL (ref 30.0–36.0)
MCV: 67.2 fL — ABNORMAL LOW (ref 80.0–100.0)
Platelets: 279 10*3/uL (ref 150–400)
RBC: 5.43 MIL/uL — ABNORMAL HIGH (ref 3.87–5.11)
RDW: 16.9 % — ABNORMAL HIGH (ref 11.5–15.5)
WBC: 22.1 10*3/uL — ABNORMAL HIGH (ref 4.0–10.5)
nRBC: 0 % (ref 0.0–0.2)

## 2021-09-10 LAB — GLUCOSE, CAPILLARY
Glucose-Capillary: 168 mg/dL — ABNORMAL HIGH (ref 70–99)
Glucose-Capillary: 176 mg/dL — ABNORMAL HIGH (ref 70–99)
Glucose-Capillary: 201 mg/dL — ABNORMAL HIGH (ref 70–99)
Glucose-Capillary: 213 mg/dL — ABNORMAL HIGH (ref 70–99)
Glucose-Capillary: 233 mg/dL — ABNORMAL HIGH (ref 70–99)

## 2021-09-10 LAB — BASIC METABOLIC PANEL
Anion gap: 5 (ref 5–15)
BUN: 13 mg/dL (ref 6–20)
CO2: 27 mmol/L (ref 22–32)
Calcium: 8.1 mg/dL — ABNORMAL LOW (ref 8.9–10.3)
Chloride: 104 mmol/L (ref 98–111)
Creatinine, Ser: 0.64 mg/dL (ref 0.44–1.00)
GFR, Estimated: >60 mL/min (ref >60)
Glucose, Bld: 233 mg/dL — ABNORMAL HIGH (ref 70–99)
Potassium: 3.9 mmol/L (ref 3.5–5.1)
Sodium: 136 mmol/L (ref 135–145)

## 2021-09-10 LAB — HEMOGLOBIN A1C
Hgb A1c MFr Bld: 11 % — ABNORMAL HIGH (ref 4.8–5.6)
Mean Plasma Glucose: 269 mg/dL

## 2021-09-10 LAB — TROPONIN I (HIGH SENSITIVITY)
Troponin I (High Sensitivity): 5 ng/L (ref <18)
Troponin I (High Sensitivity): 6 ng/L (ref <18)

## 2021-09-10 MED ORDER — LIVING WELL WITH DIABETES BOOK
Freq: Once | Status: AC
  Filled 2021-09-10: qty 1

## 2021-09-10 MED ORDER — INSULIN ASPART 100 UNIT/ML IJ SOLN
0.0000 [IU] | Freq: Three times a day (TID) | INTRAMUSCULAR | Status: DC
  Administered 2021-09-10: 7 [IU] via SUBCUTANEOUS
  Administered 2021-09-11: 4 [IU] via SUBCUTANEOUS
  Administered 2021-09-11: 3 [IU] via SUBCUTANEOUS
  Administered 2021-09-11: 4 [IU] via SUBCUTANEOUS
  Filled 2021-09-10 (×4): qty 1

## 2021-09-10 MED ORDER — METFORMIN HCL 500 MG PO TABS
500.0000 mg | ORAL_TABLET | Freq: Every day | ORAL | Status: DC
Start: 2021-09-10 — End: 2021-09-11
  Administered 2021-09-10 – 2021-09-11 (×2): 500 mg via ORAL
  Filled 2021-09-10 (×2): qty 1

## 2021-09-10 NOTE — Progress Notes (Signed)
[facility]  Hospital Day(s): 1.   Post op day(s): 1 Day Post-Op.   Interval History:  Patient seen and examined No acute events or new complaints overnight.  Patient reports she is sore and has some discomfort in chest/shoulders No fever, chills, nausea, emesis She did have a bump in leukocytosis this morning; 22.1K Hgb to 11.3; dilution Renal function normal; sCr - 0.64; UO - unmeasured No electrolyte derangements Glycemic control improved - 233 Surgical drain with 50 ccs out; serosanguinous She is on CLD; tolerating; hungry   Vital signs in last 24 hours: [min-max] current  Temp:  [97.1 F (36.2 C)-99.5 F (37.5 C)] 98.3 F (36.8 C) (07/24 0503) Pulse Rate:  [73-95] 73 (07/24 0503) Resp:  [12-19] 16 (07/24 0503) BP: (102-134)/(65-84) 113/78 (07/24 0503) SpO2:  [90 %-100 %] 93 % (07/24 0503)     Height: 5\' 7"  (170.2 cm) Weight: 127 kg BMI (Calculated): 43.84   Intake/Output last 2 shifts:  07/23 0701 - 07/24 0700 In: 3411.6 [I.V.:3161; IV Piggyback:250.6] Out: 65 [Drains:50; Blood:15]   Physical Exam:  Constitutional: alert, cooperative and no distress  Respiratory: breathing non-labored at rest  Cardiovascular: regular rate and sinus rhythm  Gastrointestinal: soft, incisional soreness, non-distended, no rebound/guarding. Surgical drain in right abdomen; output serosanguinous Integumentary: Laparoscopic incisions are CDI with dermabond, no erythema or drainage   Labs:     Latest Ref Rng & Units 09/10/2021    6:23 AM 09/08/2021    1:28 PM 06/09/2011    3:17 PM  CBC  WBC 4.0 - 10.5 K/uL 22.1  16.4  11.9   Hemoglobin 12.0 - 15.0 g/dL 06/11/2011  80.9  98.3   Hematocrit 36.0 - 46.0 % 36.5  41.9  43.7   Platelets 150 - 400 K/uL 279  378  261       Latest Ref Rng & Units 09/08/2021    1:28 PM 06/09/2011    3:17 PM  CMP  Glucose 70 - 99 mg/dL 06/11/2011  505   BUN 6 - 20 mg/dL 7  12   Creatinine 397 - 1.00 mg/dL 6.73  4.19   Sodium 3.79 -  145 mmol/L 130  141   Potassium 3.5 - 5.1 mmol/L 3.7  3.4   Chloride 98 - 111 mmol/L 98  104   CO2 22 - 32 mmol/L 22  29   Calcium 8.9 - 10.3 mg/dL 8.7  8.0   Total Protein 6.5 - 8.1 g/dL 7.5  6.4   Total Bilirubin 0.3 - 1.2 mg/dL 0.9  0.3   Alkaline Phos 38 - 126 U/L 91  72   AST 15 - 41 U/L 13  20   ALT 0 - 44 U/L 15  28     Imaging studies: No new pertinent imaging studies   Assessment/Plan:  41 y.o. female 1 Day Post-Op s/p robotic assisted laparoscopic cholecystectomy for gangrenous cholecystitis.   - Advance diet as tolerated - Continue IV Abx (Zosyn); will need PO for home   - Continue surgical drain; monitor and record output. She will need drain teaching for home.    - Monitor abdominal examination; on-going bowel function   - Pain control prn; antiemetics prn - Monitor leukocytosis; suspect elevation secondary to stress of OR - Mobilize as tolerated; did well   - Discharge Planning; Given severity of intra-operative findings and elevation in leukocytosis, I do feel she may benefit from an additional day of IV Abx. If does well, likely DC  home this afternoon vs tomorrow morning.   All of the above findings and recommendations were discussed with the patient, and the medical team, and all of patient's questions were answered to her expressed satisfaction.  -- Lynden Oxford, PA-C [facility] 09/10/2021, 7:35 AM M-F: 7am - 4pm

## 2021-09-10 NOTE — H&P (Addendum)
[place]                                                                                                       Consult note  PATIENT NAME: Melissa Mahoney    MR#:  [id number]  DATE OF BIRTH:  04-Jul-1980  DATE OF ADMISSION:  09/08/2021  PRIMARY CARE PHYSICIAN: [facility]   Patient is coming from: Home  REQUESTING/REFERRING PHYSICIAN: [place], [place] F, MD  CHIEF COMPLAINT:   Chief Complaint  Patient presents with   Chest Pain  Elevated blood glucose levels  HISTORY OF PRESENT ILLNESS:  Melissa Mahoney is a 41 y.o. female with medical history significant for type 2 diabetes mellitus and exercise-induced asthma, who is postop day 1 laparoscopic cholecystectomy for gangrenous cholecystitis, who has been hyperglycemic perioperatively.  She stated that she has been on metformin for 2 to 3 years after her last pregnancy in 2018 when she has gestational diabetes 10.  Her blood glucose measures have been well controlled on metformin for 2 to 3 years then she apparently stopped on her own without further follow-up.  She has fairly controlled asthma and the last time she used her rescue inhaler was about a year ago.  She admits to polyuria and polydipsia and initially presented with chest pain.  She describes it now as pressure with occasional radiation to her right jaw.  No fever or chills.  She has nausea without vomiting or diarrhea.  Her abdominal pain is expected postoperative pain without worsening.  Latest vital signs are within normal and she is afebrile.  Latest labs revealed blood glucose of 233 this morning and calcium 8.1 with leukocytosis 22.1 compared to 16.4 on 7/22.   EKG as reviewed by me : EKG on 7/22 revealed normal sinus rhythm with a rate of 99 with poor R wave progression and right axis deviation Imaging: Right upper quadrant ultrasound on 7/22 revealed: Gallbladder stones. There is abnormal wall thickening in gallbladder along with  possible trace amount of pericholecystic fluid. Technologist observed tenderness over the gallbladder. Findings suggest possible acute cholecystitis. There is no significant dilation of visualized proximal course of the common bile duct.  I was consulted for medical evaluation and management of her uncontrolled diabetes mellitus.   PAST MEDICAL HISTORY:   Past Medical History:  Diagnosis Date   Exercise-induced asthma    Gestational diabetes   -Type 2 diabetes mellitus  PAST SURGICAL HISTORY:   Past Surgical History:  Procedure Laterality Date   CESAREAN SECTION     I & D EXTREMITY     NO PAST SURGERIES      SOCIAL HISTORY:   Social History   Tobacco Use   Smoking status: Every Day    Packs/day: 0.50    Years: 10.00    Total pack years: 5.00    Types: Cigarettes   Smokeless tobacco: Never   Tobacco comments:    patient states that she is working with health department on a plan to quit  Substance Use Topics   Alcohol  use: No    FAMILY HISTORY:   Family History  Problem Relation Age of Onset   Diabetes Paternal Aunt    Diabetes Paternal Uncle    Diabetes Paternal Grandmother    Diabetes Paternal Uncle     DRUG ALLERGIES:   Allergies  Allergen Reactions   Iodinated Contrast Media Itching and Shortness Of Breath    Shortness of breath, itchy throat and numb lips   Shellfish Allergy Swelling    Lips, hands with swelling   Banana Swelling    Lips, hands with swelling   Iodides Itching    REVIEW OF SYSTEMS:   ROS As per history of present illness. All pertinent systems were reviewed above. Constitutional, HEENT, cardiovascular, respiratory, GI, GU, musculoskeletal, neuro, psychiatric, endocrine, integumentary and hematologic systems were reviewed and are otherwise negative/unremarkable except for positive findings mentioned above in the HPI.   MEDICATIONS AT HOME:   Prior to Admission medications   Medication Sig Start Date End Date Taking?  Authorizing Provider  acetaminophen (TYLENOL) 500 MG tablet Take 1,000 mg by mouth every 6 (six) hours as needed. Patient not taking: Reported on 09/08/2021    [provider]  albuterol (PROVENTIL HFA;VENTOLIN HFA) 108 (90 Base) MCG/ACT inhaler Inhale 2 puffs into the lungs every 6 (six) hours as needed for wheezing or shortness of breath. Patient not taking: Reported on 09/08/2021 02/02/17   Enid Derry, PA-C  azithromycin (ZITHROMAX Z-PAK) 250 MG tablet 2 pills today then 1 pill a day for 4 days Patient not taking: Reported on 09/08/2021 05/02/17   Faythe Ghee, PA-C  benzonatate (TESSALON) 200 MG capsule Take 1 capsule (200 mg total) by mouth 3 (three) times daily as needed for cough. Patient not taking: Reported on 09/08/2021 05/02/17   Faythe Ghee, PA-C  glipiZIDE (GLUCOTROL) 5 MG tablet Take 1.5 mg by mouth daily before breakfast. Patient not taking: Reported on 09/08/2021    [provider]  Multiple Vitamin (MULTIVITAMIN) tablet Take 1 tablet by mouth daily. Patient not taking: Reported on 09/08/2021    [provider]  predniSONE (DELTASONE) 50 MG tablet Take 1 tablet (50 mg total) by mouth daily with breakfast. Patient not taking: Reported on 09/08/2021 12/06/17   Jene Every, MD      VITAL SIGNS:  Blood pressure 111/73, pulse 76, temperature 98.4 F (36.9 C), temperature source Oral, resp. rate 20, height 5\' 7"  (1.702 m), weight 127 kg, SpO2 96 %, not currently breastfeeding.  PHYSICAL EXAMINATION:  Physical Exam  GENERAL:  41 y.o.-year-old African-American female patient lying in the bed with no acute distress.  EYES: Pupils equal, round, reactive to light and accommodation. No scleral icterus. Extraocular muscles intact.  HEENT: Head atraumatic, normocephalic. Oropharynx and nasopharynx clear.  NECK:  Supple, no jugular venous distention. No thyroid enlargement, no tenderness.  LUNGS: Normal breath sounds bilaterally, no wheezing,  rales,rhonchi or crepitation. No use of accessory muscles of respiration.  CARDIOVASCULAR: Regular rate and rhythm, S1, S2 normal. No murmurs, rubs, or gallops.  ABDOMEN: Soft, nondistended, with mild expected postoperative epigastric and right upper quadrant tenderness.  Bowel sounds present. No organomegaly or mass.  EXTREMITIES: No pedal edema, cyanosis, or clubbing.  NEUROLOGIC: Cranial nerves II through XII are intact. Muscle strength 5/5 in all extremities. Sensation intact. Gait not checked.  PSYCHIATRIC: The patient is alert and oriented x 3.  Normal affect and good eye contact. SKIN: No obvious rash, lesion, or ulcer.   LABORATORY PANEL:   CBC Recent Labs  Lab 09/10/21 0623  WBC 22.1*  HGB 11.3*  HCT 36.5  PLT 279   ------------------------------------------------------------------------------------------------------------------  Chemistries  Recent Labs  Lab 09/08/21 1328 09/10/21 0623  NA 130* 136  K 3.7 3.9  CL 98 104  CO2 22 27  GLUCOSE 252* 233*  BUN 7 13  CREATININE 0.55 0.64  CALCIUM 8.7* 8.1*  AST 13*  --   ALT 15  --   ALKPHOS 91  --   BILITOT 0.9  --    ------------------------------------------------------------------------------------------------------------------  Cardiac Enzymes No results for input(s): "TROPONINI" in the last 168 hours. ------------------------------------------------------------------------------------------------------------------  RADIOLOGY:  US Abdomen Limited RUQ (LIVER/GB)  Result Date: 09/08/2021 CLINICAL DATA:  Pain EXAM: ULTRASOUND ABDOMEN LIMITED RIGHT UPPER QUADRANT COMPARISON:  06/29/2007 FINDINGS: Gallbladder: Multiple gallstones are seen. Gallbladder is not distended. There is wall thickening and gallbladder. There is increased vascularity in the gallbladder wall. Technologist observed focal tenderness over the gallbladder. Common bile duct: Diameter: 2.8 mm Liver: No focal lesion identified. Within normal limits  in parenchymal echogenicity. Portal vein is patent on color Doppler imaging with normal direction of blood flow towards the liver. Other: None. IMPRESSION: Gallbladder stones. There is abnormal wall thickening in gallbladder along with possible trace amount of pericholecystic fluid. Technologist observed tenderness over the gallbladder. Findings suggest possible acute cholecystitis. There is no significant dilation of visualized proximal course of the common bile duct. Electronically Signed   By: Ernie Avena M.D.   On: 09/08/2021 14:26      IMPRESSION AND PLAN:  Assessment and Plan: Uncontrolled type 2 diabetes mellitus with hyperglycemia, without long-term current use of insulin (HCC) - The patient likely has type 2 diabetes mellitus. - We we will obtain a hemoglobin A1c. - I will place her on resistant subcutaneous NovoLog coverage with meals and at bedtime due to expected hyperglycemia with gangrenous cholecystitis and postoperative stress. - We will restart her Glucophage once daily for now. - She is on diabetic diet. - Diabetic education will be needed. - She will need a prescription for glucometer and lancets upon discharge.  Chest pain - This likely atypical, probably referred pain postoperatively and initially from acute cholecystitis. - Given her risk factors including uncontrolled diabetes mellitus and obesity, will obtain serial troponins and repeat EKG. - I agree with PPI therapy for possible underlying reflux  Status post laparoscopic cholecystectomy - This was for gangrenous cholecystitis. - She is postoperative day 1. - Pain management per Dr. Everlene Farrier.  Exercise-induced asthma - We will place her on as needed albuterol MDI.    DVT prophylaxis: Lovenox.  Advanced Care Planning:  Code Status: full code.  Family Communication:  The plan of care was discussed in details with the patient (and family). I answered all questions. The patient agreed to proceed with the  above mentioned plan. Further management will depend upon hospital course. Disposition Plan: Back to previous home environment  All the records are reviewed and case discussed with ED provider.  Status is: Inpatient                             Dispo: The patient is from: Home              Anticipated d/c is to: Home              Patient currently is not medically stable to d/c.              Difficult to  place patient: No  Hannah Beat M.D on 09/10/2021 at 1:30 PM  [facility]   From 7 PM-7 AM, contact night-coverage [email or web address]  CC: Primary care physician; [facility]

## 2021-09-10 NOTE — Inpatient Diabetes Management (Addendum)
Inpatient Diabetes Program Recommendations  AACE/ADA: New Consensus Statement on Inpatient Glycemic Control (2015)  Target Ranges:  Prepandial:   less than 140 mg/dL      Peak postprandial:   less than 180 mg/dL (1-2 hours)      Critically ill patients:  140 - 180 mg/dL    Latest Reference Range & Units 09/08/21 13:28  Hemoglobin A1C 4.8 - 5.6 % 11.2 (H)  274 mg/dl    Latest Reference Range & Units 09/09/21 08:27 09/09/21 15:05 09/09/21 16:48 09/09/21 21:55 09/09/21 23:24  Glucose-Capillary 70 - 99 mg/dL 204 (H)  5 units Novolog  10 mg Decadron given @1136  257 (H) 267 (H)  11 units Novolog 290 (H) 344 (H)  4 units Novolog    Latest Reference Range & Units 09/10/21 08:14  Glucose-Capillary 70 - 99 mg/dL 213 (H)  (H): Data is abnormally high  Admit with: Acute Cholecystitis  History: GDM  Home DM Meds: Glipizide 1.5 mg daily (NOT taking)  Current Orders: Novolog Moderate Correction Scale/ SSI (0-15 units) TID AC + HS      Novolog 4 units TID with meals    Lap Chole yest afternoon  PCP: [facility]   MD- Is this a new diagnosis of diabetes?  If so, please address with pt and the diabetes RN will speak with pt as well.  MD- Pt open to restarting oral meds at home for her diabetes.  Does NOT want to start insulin at this time.  Recommend Metformin 500 mg BID (may also consider adding low dose Glipizide as well if needed)    Addendum 12:20pm--Met w/ pt at bedside to discuss new diabetes diagnosis.  Pt told me she had GDM with her last pregnancy in 2018--Was given Rx to start Metformin BID at home after delivery of her baby--Does not remember what her A1c was.  Has been seeing MD at the [facility] for years and told me she stopped taking the Metformin back in 2021 (self stopped--stated she felt fine and didn't think she needed any meds)--Was checking CBGs periodically at home but stated she had issues getting her strips refilled at the [place] and just quit checking CBGs at home.  Has Medicaid family planning but does not have health insurance through her job.  Is open to going back to [facility] and is open to restarting meds for her sugars at home.  Have placed TOC consult.  Spoke with pt about new diagnosis.  Discussed A1C results with her and explained what an A1C is, basic pathophysiology of DM Type 2, basic home care, basic diabetes diet nutrition principles, importance of checking CBGs and maintaining good CBG control to prevent long-term and short-term complications.  Reviewed signs and symptoms of hyperglycemia and hypoglycemia and how to treat hypoglycemia at home.  Also reviewed blood sugar goals and A1c goals for home.  Also discussed DM diet information with patient.  Encouraged patient to avoid beverages with sugar (regular soda, sweet tea, lemonade, fruit juice) and to consume mostly water.  Discussed what foods contain carbohydrates and how carbohydrates affect the body's blood sugar levels.  Encouraged patient to be careful with her portion sizes (especially grains, starchy vegetables, and fruits).  Have asked MD to give pt a Rx for home CBG meter and also encouraged pt to go to [facility] and buy CBG meter OTC if it's less expensive to get CBG meter and supplies through [place] versus [facility].  RNs to  provide ongoing basic DM education at bedside with this patient.  Have ordered educational booklet and attached educational videos on diabetes to pt's AVS.  Have also placed RD consult for DM diet education for this patient.     --Will follow patient during hospitalization--  Wyn Quaker RN, MSN, [place] Diabetes Coordinator Inpatient Glycemic Control Team Team Pager: [phone] (8a-5p)

## 2021-09-10 NOTE — Assessment & Plan Note (Signed)
-   We will place her on as needed albuterol MDI.

## 2021-09-10 NOTE — Assessment & Plan Note (Addendum)
-   This likely atypical, probably referred pain postoperatively and initially from acute cholecystitis. - Given her risk factors including uncontrolled diabetes mellitus and obesity, will obtain serial troponins and repeat EKG. - I agree with PPI therapy for possible underlying reflux

## 2021-09-10 NOTE — Assessment & Plan Note (Signed)
-   This was for gangrenous cholecystitis. - She is postoperative day 1. - Pain management per Dr. Everlene Farrier.

## 2021-09-10 NOTE — Assessment & Plan Note (Signed)
-   The patient likely has type 2 diabetes mellitus. - We we will obtain a hemoglobin A1c. - I will place her on resistant subcutaneous NovoLog coverage with meals and at bedtime due to expected hyperglycemia with gangrenous cholecystitis and postoperative stress. - We will restart her Glucophage once daily for now. - She is on diabetic diet. - Diabetic education will be needed. - She will need a prescription for glucometer and lancets upon discharge.

## 2021-09-10 NOTE — Anesthesia Postprocedure Evaluation (Signed)
Anesthesia Post Note  Patient: Melissa Mahoney  Procedure(s) Performed: XI ROBOTIC ASSISTED LAPAROSCOPIC CHOLECYSTECTOMY (Abdomen) INDOCYANINE GREEN FLUORESCENCE IMAGING (ICG) (Abdomen)  Patient location during evaluation: PACU Anesthesia Type: General Level of consciousness: awake and alert Pain management: pain level controlled Vital Signs Assessment: post-procedure vital signs reviewed and stable Respiratory status: spontaneous breathing, nonlabored ventilation, respiratory function stable and patient connected to nasal cannula oxygen Cardiovascular status: blood pressure returned to baseline and stable Postop Assessment: no apparent nausea or vomiting Anesthetic complications: no   No notable events documented.   Last Vitals:  Vitals:   09/10/21 0503 09/10/21 0818  BP: 113/78 111/73  Pulse: 73 76  Resp: 16 20  Temp: 36.8 C 36.9 C  SpO2: 93% 96%    Last Pain:  Vitals:   09/10/21 1106  TempSrc:   PainSc: 4                  Lenard Simmer

## 2021-09-10 NOTE — Plan of Care (Signed)
Nutrition Education Note   RD consulted for nutrition education regarding diabetes.   41 y/o female with h/o asthma, morbid obesity and DM who is admitted with cholecystis now s/p cholecystectomy 7/23.   Met with pt in room today. Pt reports good appetite and oral intake at baseline and in hospital. Pt reports eating grilled chicken and salad for lunch today. Pt's main complaint today is gas pains, burping and abdominal soreness. 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 There is no recent weight history in chart to determine if any significant changes. RD discussed with pt today the importance of adequate nutrition needed for post op healing. RD also discussed a low fat diet after cholecystectomy.   Lab Results  Component Value Date   HGBA1C 11.2 (H) 09/08/2021   RD provided "Nutrition and Type II Diabetes" handout from the Academy of Nutrition and Dietetics. Discussed different food groups and their effects on blood sugar, emphasizing carbohydrate-containing foods. Provided list of carbohydrates and recommended serving sizes of common foods.  Discussed importance of controlled and consistent carbohydrate intake throughout the day. Provided examples of ways to balance meals/snacks and encouraged intake of high-fiber, whole grain complex carbohydrates. Teach back method used.  Expect good compliance.  Body mass index is 43.85 kg/m. Pt meets criteria for morbid obesity based on current BMI.  Nutrition-Focused physical exam completed. Findings are no fat depletion, no muscle depletion and no edema.   Current diet order is HH/CHO modified, patient is consuming approximately 100% of meals at this time. Labs and medications reviewed. No further nutrition interventions warranted at this time. RD contact information provided. If additional nutrition issues arise, please re-consult RD.  Koleen Distance MS, RD, LDN Please refer to [facility] for RD and/or RD  on-call/weekend/after hours pager

## 2021-09-11 ENCOUNTER — Other Ambulatory Visit: Payer: Self-pay

## 2021-09-11 LAB — CBC
HCT: 36.3 % (ref 36.0–46.0)
Hemoglobin: 11.1 g/dL — ABNORMAL LOW (ref 12.0–15.0)
MCH: 20.7 pg — ABNORMAL LOW (ref 26.0–34.0)
MCHC: 30.6 g/dL (ref 30.0–36.0)
MCV: 67.9 fL — ABNORMAL LOW (ref 80.0–100.0)
Platelets: 332 10*3/uL (ref 150–400)
RBC: 5.35 MIL/uL — ABNORMAL HIGH (ref 3.87–5.11)
RDW: 17.2 % — ABNORMAL HIGH (ref 11.5–15.5)
WBC: 15.7 10*3/uL — ABNORMAL HIGH (ref 4.0–10.5)
nRBC: 0 % (ref 0.0–0.2)

## 2021-09-11 LAB — GLUCOSE, CAPILLARY
Glucose-Capillary: 133 mg/dL — ABNORMAL HIGH (ref 70–99)
Glucose-Capillary: 179 mg/dL — ABNORMAL HIGH (ref 70–99)

## 2021-09-11 LAB — SURGICAL PATHOLOGY

## 2021-09-11 MED ORDER — AMOXICILLIN-POT CLAVULANATE 875-125 MG PO TABS
1.0000 | ORAL_TABLET | Freq: Two times a day (BID) | ORAL | 0 refills | Status: AC
  Filled 2021-09-11: qty 24, 12d supply, fill #0

## 2021-09-11 MED ORDER — IBUPROFEN 600 MG PO TABS: 600.0000 mg | ORAL_TABLET | Freq: Four times a day (QID) | ORAL | 0 refills | Status: DC | PRN

## 2021-09-11 MED ORDER — ORAL CARE MOUTH RINSE: 15.0000 mL | OROMUCOSAL | Status: DC | PRN

## 2021-09-11 MED ORDER — OXYCODONE HCL 5 MG PO TABS: 5.0000 mg | ORAL_TABLET | Freq: Four times a day (QID) | ORAL | 0 refills | Status: DC | PRN

## 2021-09-11 MED ORDER — OXYCODONE HCL 5 MG PO TABS
5.0000 mg | ORAL_TABLET | Freq: Four times a day (QID) | ORAL | 0 refills | Status: AC | PRN
  Filled 2021-09-11: qty 20, 5d supply, fill #0

## 2021-09-11 MED ORDER — IBUPROFEN 600 MG PO TABS
600.0000 mg | ORAL_TABLET | Freq: Four times a day (QID) | ORAL | 0 refills | Status: AC | PRN
  Filled 2021-09-11: qty 30, 8d supply, fill #0

## 2021-09-11 MED ORDER — METFORMIN HCL 500 MG PO TABS
500.0000 mg | ORAL_TABLET | Freq: Every day | ORAL | 2 refills | Status: AC
  Filled 2021-09-11: qty 30, 30d supply, fill #0

## 2021-09-11 MED ORDER — METFORMIN HCL 500 MG PO TABS: 500.0000 mg | ORAL_TABLET | Freq: Every day | ORAL | 1 refills | Status: DC

## 2021-09-11 MED ORDER — BLOOD GLUCOSE MONITOR KIT: PACK | 0 refills | Status: AC

## 2021-09-11 MED ORDER — AMOXICILLIN-POT CLAVULANATE 875-125 MG PO TABS: 1.0000 | ORAL_TABLET | Freq: Two times a day (BID) | ORAL | 0 refills | Status: DC

## 2021-09-11 NOTE — Progress Notes (Signed)
Mobility Specialist - Progress Note    09/11/21 0944  Mobility  Activity Stood at bedside;Dangled on edge of bed;Ambulated independently in hallway  Level of Assistance Contact guard assist, steadying assist  Assistive Device None  Distance Ambulated (ft) 160 ft  Activity Response Tolerated well  $Mobility charge 1 Mobility    Pt supine in bed on RA upon arrival. Pt complained of pain on right side, felt "gassy" Pt STS with CGA and ambulates indep around NS. Pt complained of slightly blurry vision during walk. Pt left in chair with needs in reach.    Terrilyn Saver  Mobility Specialist  09/11/2021

## 2021-09-11 NOTE — Progress Notes (Signed)
Discharge instructions reviewed with the patient. IV removed. Patient sent out via wheelchair to her waiting  ride 

## 2021-09-11 NOTE — Discharge Summary (Signed)
[facility] SURGICAL DISCHARGE SUMMARY  Patient ID: Melissa Mahoney MRN: [id number] DOB/AGE: 1980-12-03 41 y.o.  Admit date: 09/08/2021 Discharge date: 09/11/2021  Discharge Diagnoses Patient Active Problem List   Diagnosis Date Noted   Uncontrolled type 2 diabetes mellitus with hyperglycemia, without long-term current use of insulin ([place]) 09/10/2021   Status post laparoscopic cholecystectomy 09/10/2021   Cholecystitis 09/08/2021    Consultants Medicine  Procedures 09/09/2021:  Robotic assisted laparoscopic cholecystectomy  HPI: Melissa Mahoney is a 41 y.o. female Melissa Mahoney is a 41 y.o. female, history of asthma, gestational diabetes, presents to the emergency department for acute RUQ and epigastric pain x3 days.  Pain is severe has been present for at least a day constantly.  Reports increased belching, bloating, and heartburn.  Recently ate buffet food a few days ago and believes it may be associated with that.  She states that she takes ibuprofen frequently for pain.  Endorses 1 episode of vomiting this morning, nonbloody/nonbilious.  Denies fever/chills, shortness of breath, diarrhea, hematochezia, hematemesis, dizziness/lightheadedness, pain/numbness in upper or lower extremities, or leg swelling. She denies any fevers or chills no evidence of biliary obstruction.  She does have a history of C-section.  She does have a high BMI of 44.  She is able to perform more than 4 METS of activity without any shortness of breath or chest pain.  She does have an allergy to iodine and reports rash and some swelling.  We will do ICD and I did explain to her that it has less than 5% content but I will make it safer to perform visualization of the common bile duct.  I do think that the benefits of ICG vastly outweighed the risk in this case I did have a discussion with the pr , family and pharmacy regarding this.     She did have an ultrasound that I personally  reviewed showing evidence of multiple gallstones Some thickening of the gallbladder wall consistent with acute  Cholecystitis. She does have a white count of 16,000 with a left shift.  Hospital Course: Informed consent was obtained and documented, and patient underwent robotic assisted laparoscopic cholecystectomy (Dr Dahlia Byes, 09/09/2021).  Post-operatively, patient did reasonably well. She did have poor glycemic control and medicine was consulted for management post op. Otherwise, advancement of patient's diet and ambulation were well-tolerated. The remainder of patient's hospital course was essentially unremarkable, and discharge planning was initiated accordingly with patient safely able to be discharged home with appropriate discharge instructions, antibiotics (Augmentin x12 days), pain control, and outpatient follow-up after all of her questions were answered to her expressed satisfaction.   Discharge Condition: Good   Physical Examination:  Constitutional: alert, cooperative and no distress  Respiratory: breathing non-labored at rest  Cardiovascular: regular rate and sinus rhythm  Gastrointestinal: soft, incisional soreness, non-distended, no rebound/guarding. Surgical drain in right abdomen; output serosanguinous Integumentary: Laparoscopic incisions are CDI with dermabond, no erythema or drainage    Allergies as of 09/11/2021       Reactions   Iodinated Contrast Media Itching, Shortness Of Breath   Shortness of breath, itchy throat and numb lips   Shellfish Allergy Swelling   Lips, hands with swelling   Banana Swelling   Lips, hands with swelling   Iodides Itching        Medication List     STOP taking these medications    acetaminophen 500 MG tablet Commonly known as: TYLENOL   albuterol 108 (90 Base) MCG/ACT inhaler Commonly known as: VENTOLIN  HFA   azithromycin 250 MG tablet Commonly known as: Zithromax Z-Pak   benzonatate 200 MG capsule Commonly known as:  TESSALON   glipiZIDE 5 MG tablet Commonly known as: GLUCOTROL   multivitamin tablet   predniSONE 50 MG tablet Commonly known as: DELTASONE       TAKE these medications    amoxicillin-clavulanate 875-125 MG tablet Commonly known as: AUGMENTIN Take 1 tablet by mouth 2 (two) times daily for 12 days.   blood glucose meter kit and supplies Kit Dispense based on patient and insurance preference. Use up to four times daily as directed.   ibuprofen 600 MG tablet Commonly known as: ADVIL Take 1 tablet (600 mg total) by mouth every 6 (six) hours as needed.   metFORMIN 500 MG tablet Commonly known as: GLUCOPHAGE Take 1 tablet (500 mg total) by mouth daily with breakfast. Start taking on: September 12, 2021   oxyCODONE 5 MG immediate release tablet Commonly known as: Oxy IR/ROXICODONE Take 1 tablet (5 mg total) by mouth every 6 (six) hours as needed for severe pain or breakthrough pain.          Follow-up [place], [facility]. Schedule an appointment as soon as possible for a visit in 1 week(s).   Specialty: General Practice Why: hospital follow up; uncontrolled diabetes Contact information: [address] [place] [address] [phone]         Jules Husbands, MD. Schedule an appointment as soon as possible for a visit on 09/17/2021.   Specialty: General Surgery Why: s/p lap cholecystectomy; has drain Contact information: [address] [place] [address] [phone]                  Time spent on discharge management including discussion of hospital course, clinical condition, outpatient instructions, prescriptions, and follow up with the patient and members of the medical team: >30 minutes  -- Edison Simon , PA-C [facility]  09/11/2021, 12:20 PM [phone] M-F: 7am - 4pm

## 2021-09-11 NOTE — TOC Progression Note (Signed)
Transition of Care [facility]) - Progression Note    Patient Details  Name: Leiani Enright MRN: [id number] Date of Birth: 1980/12/24  Transition of Care [facility]) CM/SW [place], RN Phone Number: 09/11/2021, 2:44 PM  Clinical Narrative:            Expected Discharge Plan and Services, Met with the patient and provided [facility] application as she is not able to get into [facility]   until next year.  Her medications will be sent to [facility], she understands that they will not fill narcotics at no cost.  Thru Medication Mgt.  She will get those at [facility].          Expected Discharge Date: 09/11/21                                     Social Determinants of Health (SDOH) Interventions    Readmission Risk Interventions     No data to display

## 2021-09-11 NOTE — Discharge Instructions (Signed)
In addition to included general post-operative instructions,  Diet: Resume home diet. Recommend avoiding or limiting fatty/greasy foods over the next few days/week. If you do eat these, you may (or may not) notice diarrhea. This is expected while your body adjusts to not having a gallbladder, and it typically resolves with time.    Activity: No heavy lifting >20 pounds (children, pets, laundry, garbage) for 4 weeks, but light activity and walking are encouraged. Do not drive or drink alcohol if taking narcotic pain medications or having pain that might distract from driving.  Wound care: IF you can keep drain site water proofed, you may shower/get incision wet with soapy water and pat dry (do not rub incisions), but no baths or submerging incision underwater until follow-up.   Drain: Monitor and record output daily; hand out given for this  Medications: Resume all home medications. For mild to moderate pain: acetaminophen (Tylenol) or ibuprofen/naproxen (if no kidney disease). Combining Tylenol with alcohol can substantially increase your risk of causing liver disease. Narcotic pain medications, if prescribed, can be used for severe pain, though may cause nausea, constipation, and drowsiness. Do not combine Tylenol and Percocet (or similar) within a 6 hour period as Percocet (and similar) contain(s) Tylenol. If you do not need the narcotic pain medication, you do not need to fill the prescription.  Call office [phone] / [phone]) at any time if any questions, worsening pain, fevers/chills, bleeding, drainage from incision site, or other concerns.

## 2021-09-14 ENCOUNTER — Other Ambulatory Visit: Payer: Self-pay

## 2021-09-14 ENCOUNTER — Other Ambulatory Visit: Payer: Self-pay | Admitting: Pharmacy Technician

## 2021-09-14 NOTE — Patient Outreach (Signed)
Patient only signed DOH Attestation.  Would need to provide current year's household income if PAP medications were needed.  Zia Kanner J. Deloyd Handy Patient Advocate Specialist [facility]  

## 2021-09-17 ENCOUNTER — Ambulatory Visit (INDEPENDENT_AMBULATORY_CARE_PROVIDER_SITE_OTHER): Payer: Self-pay | Admitting: Surgery

## 2021-09-17 ENCOUNTER — Encounter: Payer: Self-pay | Admitting: Surgery

## 2021-09-17 VITALS — BP 133/85 | HR 91 | Temp 98.1°F | Ht 67.0 in | Wt 261.6 lb

## 2021-09-17 DIAGNOSIS — Z09 Encounter for follow-up examination after completed treatment for conditions other than malignant neoplasm: Secondary | ICD-10-CM

## 2021-09-17 DIAGNOSIS — K8000 Calculus of gallbladder with acute cholecystitis without obstruction: Secondary | ICD-10-CM

## 2021-09-17 NOTE — Progress Notes (Signed)
41 year old female status post robotic cholecystectomy empyema of the gallbladder. Minimal output from drain, serous Chills no fevers tolerating diet. Getting diabetic care  PE NAD Abd: soft , drain removed. Incisions c./d/I  A/p Doing well w/o complications RTC prn No heavy lifting

## 2021-09-17 NOTE — Patient Instructions (Signed)
If you have any concerns or questions, please feel free to call our office. Follow up as needed.    Gallbladder Eating Plan  High blood cholesterol, obesity, a sedentary lifestyle, an unhealthy diet, and diabetes are risk factors for developing gallstones. If you have a gallbladder condition, you may have trouble digesting fats and tolerating high fat intake. Eating a low-fat diet can help reduce your symptoms and may be helpful before and after having surgery to remove your gallbladder (cholecystectomy). Your health care provider may recommend that you work with a dietitian to help you reduce the amount of fat in your diet. What are tips for following this plan? General guidelines Limit your fat intake to less than 30% of your total daily calories. If you eat around 1,800 calories each day, this means eating less than 60 grams (g) of fat per day. Fat is an important part of a healthy diet. Eating a low-fat diet can make it hard to maintain a healthy body weight. Ask your dietitian how much fat, calories, and other nutrients you need each day. Eat small, frequent meals throughout the day instead of three large meals. Drink at least 8-10 cups (1.9-2.4 L) of fluid a day. Drink enough fluid to keep your urine pale yellow. If you drink alcohol: Limit how much you have to: 0-1 drink a day for women who are not pregnant. 0-2 drinks a day for men. Know how much alcohol is in a drink. In the [place] one drink equals one 12 oz bottle of beer (355 mL), one 5 oz glass of wine (148 mL), or one 1 oz glass of hard liquor (44 mL). Reading food labels  Check nutrition facts on food labels for the amount of fat per serving. Choose foods with less than 3 grams of fat per serving. Shopping Choose nonfat and low-fat healthy foods. Look for the words "nonfat," "low-fat," or "fat-free." Avoid buying processed or prepackaged foods. Cooking Cook using low-fat methods, such as baking, broiling, grilling, or  boiling. Cook with small amounts of healthy fats, such as olive oil, grapeseed oil, canola oil, avocado oil, or sunflower oil. What foods are recommended?  All fresh, frozen, or canned fruits and vegetables. Whole grains. Low-fat or nonfat (skim) milk and yogurt. Lean meat, skinless poultry, fish, eggs, and beans. Low-fat protein supplement powders or drinks. Spices and herbs. The items listed above may not be a complete list of foods and beverages you can eat and drink. Contact a dietitian for more information. What foods are not recommended? High-fat foods. These include baked goods, fast food, fatty cuts of meat, ice cream, french toast, sweet rolls, pizza, cheese bread, foods covered with butter, creamy sauces, or cheese. Fried foods. These include french fries, tempura, battered fish, breaded chicken, fried breads, and sweets. Foods that cause bloating and gas. The items listed above may not be a complete list of foods that you should avoid. Contact a dietitian for more information. Summary A low-fat diet can be helpful if you have a gallbladder condition, or before and after gallbladder surgery. Limit your fat intake to less than 30% of your total daily calories. This is about 60 g of fat if you eat 1,800 calories each day. Eat small, frequent meals throughout the day instead of three large meals. This information is not intended to replace advice given to you by your health care provider. Make sure you discuss any questions you have with your health care provider. Document Revised: 01/19/2021 Document Reviewed: 01/19/2021 Elsevier   Patient Education  2023 Else[employer] Inc[employer]  

## 2023-05-03 ENCOUNTER — Emergency Department

## 2023-05-03 ENCOUNTER — Other Ambulatory Visit: Payer: Self-pay

## 2023-05-03 ENCOUNTER — Emergency Department
Admission: EM | Admit: 2023-05-03 | Discharge: 2023-05-03 | Disposition: A | Discharge status: Home / Self Care | Attending: Emergency Medicine | Admitting: Emergency Medicine

## 2023-05-03 DIAGNOSIS — L03011 Cellulitis of right finger: Secondary | ICD-10-CM | POA: Insufficient documentation

## 2023-05-03 DIAGNOSIS — E871 Hypo-osmolality and hyponatremia: Secondary | ICD-10-CM | POA: Diagnosis not present

## 2023-05-03 DIAGNOSIS — F17210 Nicotine dependence, cigarettes, uncomplicated: Secondary | ICD-10-CM | POA: Insufficient documentation

## 2023-05-03 DIAGNOSIS — J181 Lobar pneumonia, unspecified organism: Secondary | ICD-10-CM | POA: Diagnosis not present

## 2023-05-03 DIAGNOSIS — J189 Pneumonia, unspecified organism: Secondary | ICD-10-CM | POA: Diagnosis not present

## 2023-05-03 DIAGNOSIS — J984 Other disorders of lung: Secondary | ICD-10-CM | POA: Diagnosis not present

## 2023-05-03 DIAGNOSIS — R0789 Other chest pain: Secondary | ICD-10-CM | POA: Diagnosis not present

## 2023-05-03 DIAGNOSIS — R918 Other nonspecific abnormal finding of lung field: Secondary | ICD-10-CM | POA: Diagnosis not present

## 2023-05-03 DIAGNOSIS — R079 Chest pain, unspecified: Secondary | ICD-10-CM | POA: Diagnosis not present

## 2023-05-03 LAB — RESP PANEL BY RT-PCR (RSV, FLU A&B, COVID)  RVPGX2
Influenza A by PCR: NEGATIVE
Influenza B by PCR: NEGATIVE
Resp Syncytial Virus by PCR: NEGATIVE
SARS Coronavirus 2 by RT PCR: NEGATIVE

## 2023-05-03 LAB — CBC
HCT: 41 % (ref 36.0–46.0)
Hemoglobin: 13.3 g/dL (ref 12.0–15.0)
MCH: 22.4 pg — ABNORMAL LOW (ref 26.0–34.0)
MCHC: 32.4 g/dL (ref 30.0–36.0)
MCV: 69 fL — ABNORMAL LOW (ref 80.0–100.0)
Platelets: 322 10*3/uL (ref 150–400)
RBC: 5.94 MIL/uL — ABNORMAL HIGH (ref 3.87–5.11)
RDW: 17.9 % — ABNORMAL HIGH (ref 11.5–15.5)
WBC: 8.1 10*3/uL (ref 4.0–10.5)
nRBC: 0 % (ref 0.0–0.2)

## 2023-05-03 LAB — BASIC METABOLIC PANEL
Anion gap: 9 (ref 5–15)
BUN: 8 mg/dL (ref 6–20)
CO2: 22 mmol/L (ref 22–32)
Calcium: 8.4 mg/dL — ABNORMAL LOW (ref 8.9–10.3)
Chloride: 98 mmol/L (ref 98–111)
Creatinine, Ser: 0.55 mg/dL (ref 0.44–1.00)
GFR, Estimated: >60 mL/min (ref >60)
Glucose, Bld: 289 mg/dL — ABNORMAL HIGH (ref 70–99)
Potassium: 4 mmol/L (ref 3.5–5.1)
Sodium: 129 mmol/L — ABNORMAL LOW (ref 135–145)

## 2023-05-03 LAB — TROPONIN I (HIGH SENSITIVITY): Troponin I (High Sensitivity): <2 ng/L (ref <18)

## 2023-05-03 MED ORDER — CEPHALEXIN 500 MG PO CAPS: 500.0000 mg | ORAL_CAPSULE | Freq: Three times a day (TID) | ORAL | 0 refills | Status: AC

## 2023-05-03 MED ORDER — AZITHROMYCIN 250 MG PO TABS: ORAL_TABLET | ORAL | 0 refills | Status: AC

## 2023-05-03 NOTE — ED Triage Notes (Signed)
 Pt to ED for L upper back pain that radiates to L chest since 4 days ago. Has had cough and cold since 1 week ago. Current smoker. Also pt had a hangnail to R middle finger which she pulled and now area above fingernail is infected. Also since 4 days. Has been applying peroxide.

## 2023-05-03 NOTE — ED Provider Notes (Signed)
 [facility] Provider Note    Event Date/Time   First MD Initiated Contact with Patient 05/03/23 1037     (approximate)   History   Chest Pain   HPI  Melissa Mahoney is a 43 y.o. female with a history of prediabetes and cigarette smoking who presents with complaints of left-sided chest discomfort for approximately 4 days.  She reports some discomfort in her chest and in her back, denies injury to the area.  Possible chills.  No shortness of breath, some pleurisy.     Physical Exam   Triage Vital Signs: ED Triage Vitals  Encounter Vitals Group     BP 05/03/23 0935 (!) 156/100     Systolic BP Percentile --      Diastolic BP Percentile --      Pulse Rate 05/03/23 0935 84     Resp 05/03/23 0935 20     Temp 05/03/23 0935 97.9 F (36.6 C)     Temp Source 05/03/23 0935 Oral     SpO2 05/03/23 0935 100 %     Weight 05/03/23 0934 117.9 kg (260 lb)     Height 05/03/23 0934 1.676 m (5\' 6" )     Head Circumference --      Peak Flow --      Pain Score 05/03/23 0931 8     Pain Loc --      Pain Education --      Exclude from Growth Chart --     Most recent vital signs: Vitals:   05/03/23 1115 05/03/23 1125  BP:    Pulse: 74   Resp: (!) 23   Temp:  98 F (36.7 C)  SpO2: 98%      General: Awake, no distress.  CV:  Good peripheral perfusion.  Resp:  Normal effort.  Clear to auscultation bilaterally Abd:  No distention.  Other:  Paronychia to the right index finger laterally   ED Results / Procedures / Treatments   Labs (all labs ordered are listed, but only abnormal results are displayed) Labs Reviewed  BASIC METABOLIC PANEL - Abnormal; Notable for the following components:      Result Value   Sodium 129 (*)    Glucose, Bld 289 (*)    Calcium 8.4 (*)    All other components within normal limits  CBC - Abnormal; Notable for the following components:   RBC 5.94 (*)    MCV 69.0 (*)    MCH 22.4 (*)    RDW 17.9 (*)    All other  components within normal limits  RESP PANEL BY RT-PCR (RSV, FLU A&B, COVID)  [id number]  POC URINE PREG, ED  TROPONIN I (HIGH SENSITIVITY)     EKG  ED ECG REPORT I, Jene Every, the attending physician, personally viewed and interpreted this ECG.  Date: 05/03/2023  Rhythm: normal sinus rhythm QRS Axis: normal Intervals: normal ST/T Wave abnormalities: normal Narrative Interpretation: no evidence of acute ischemia    RADIOLOGY Chest x-ray viewed interpret by me, possible left upper lobe infiltrate, confirm a radiology    PROCEDURES:  Critical Care performed:   .Incision and Drainage  Date/Time: 05/03/2023 12:15 PM  Performed by: Jene Every, MD Authorized by: Jene Every, MD   Consent:    Consent obtained:  Verbal   Consent given by:  Patient   Risks discussed:  Bleeding, incomplete drainage and pain Universal protocol:    Patient identity confirmed:  Verbally with patient Location:    Type:  Abscess   Size:  0.5   Location:  Upper extremity   Upper extremity location:  Finger   Finger location:  R index finger Pre-procedure details:    Skin preparation:  Antiseptic wash Sedation:    Sedation type:  None Anesthesia:    Anesthesia method:  None Procedure type:    Complexity:  Simple Procedure details:    Incision types:  Stab incision   Incision depth:  Dermal   Drainage:  Purulent   Drainage amount:  Moderate   Wound treatment:  Wound left open   Packing materials:  None Post-procedure details:    Procedure completion:  Tolerated well, no immediate complications    MEDICATIONS ORDERED IN ED: Medications - No data to display   IMPRESSION / MDM / ASSESSMENT AND PLAN / ED COURSE  I reviewed the triage vital signs and the nursing notes. Patient's presentation is most consistent with acute presentation with potential threat to life or bodily function.  Patient presents with chest discomfort as detailed above, differential includes ACS,  pneumonia, pneumothorax, myocarditis, doubt acute aortic syndrome, doubt PE.  Normal heart rate, no shortness of breath  Lab work overall quite reassuring, undetectable high sensitive troponin, mild hyponatremia she has had chronically.  Influenza test is negative.  Chest x-ray suspicious for early pneumonia in the left upper lobe which is where she is having her discomfort, will start the patient on Keflex, Z-Pak  Patient with right finger paronychia, I&D performed with good drainage.  Appropriate for discharge, outpatient follow-up, return precautions discussed, she agrees with this plan.      FINAL CLINICAL IMPRESSION(S) / ED DIAGNOSES   Final diagnoses:  Community acquired pneumonia of left upper lobe of lung  Paronychia of finger of right hand     Rx / DC Orders   ED Discharge Orders          Ordered    cephALEXin (KEFLEX) 500 MG capsule  3 times daily        05/03/23 1116    azithromycin (ZITHROMAX Z-PAK) 250 MG tablet        05/03/23 1116             Note:  This document was prepared using Dragon voice recognition software and may include unintentional dictation errors.   Jene Every, MD 05/03/23 [phone]
